# Patient Record
Sex: Female | Born: 1945 | Hispanic: No | Marital: Married | Smoking: Former smoker
Health system: Southern US, Community
[De-identification: ages and names within clinical notes are randomized; demographics above are authoritative.]

## PROBLEM LIST (undated history)

## (undated) DIAGNOSIS — R7303 Prediabetes: Secondary | ICD-10-CM

## (undated) DIAGNOSIS — I639 Cerebral infarction, unspecified: Secondary | ICD-10-CM

## (undated) DIAGNOSIS — R002 Palpitations: Secondary | ICD-10-CM

## (undated) DIAGNOSIS — M1990 Unspecified osteoarthritis, unspecified site: Secondary | ICD-10-CM

## (undated) DIAGNOSIS — Z9889 Other specified postprocedural states: Secondary | ICD-10-CM

## (undated) DIAGNOSIS — R112 Nausea with vomiting, unspecified: Secondary | ICD-10-CM

## (undated) DIAGNOSIS — I10 Essential (primary) hypertension: Secondary | ICD-10-CM

## (undated) HISTORY — PX: OTHER SURGICAL HISTORY: SHX169

---

## 2000-12-05 ENCOUNTER — Other Ambulatory Visit: Admission: RE | Admit: 2000-12-05 | Discharge: 2000-12-05 | Payer: Self-pay | Admitting: Internal Medicine

## 2000-12-13 ENCOUNTER — Encounter: Payer: Self-pay | Admitting: Internal Medicine

## 2000-12-13 ENCOUNTER — Encounter: Admission: RE | Admit: 2000-12-13 | Discharge: 2000-12-13 | Payer: Self-pay | Admitting: Internal Medicine

## 2001-03-20 ENCOUNTER — Encounter: Payer: Self-pay | Admitting: Internal Medicine

## 2001-03-20 ENCOUNTER — Inpatient Hospital Stay (HOSPITAL_COMMUNITY): Admission: EM | Admit: 2001-03-20 | Discharge: 2001-03-21 | Payer: Self-pay | Admitting: Emergency Medicine

## 2001-10-06 ENCOUNTER — Encounter: Admission: RE | Admit: 2001-10-06 | Discharge: 2002-01-04 | Payer: Self-pay

## 2005-11-22 ENCOUNTER — Other Ambulatory Visit: Admission: RE | Admit: 2005-11-22 | Discharge: 2005-11-22 | Payer: Self-pay | Admitting: Gynecology

## 2011-04-20 ENCOUNTER — Other Ambulatory Visit: Payer: Self-pay | Admitting: Internal Medicine

## 2011-04-20 DIAGNOSIS — Z1231 Encounter for screening mammogram for malignant neoplasm of breast: Secondary | ICD-10-CM

## 2011-05-10 ENCOUNTER — Ambulatory Visit
Admission: RE | Admit: 2011-05-10 | Discharge: 2011-05-10 | Disposition: A | Discharge status: Home / Self Care | Payer: Medicare HMO | Source: Ambulatory Visit | Attending: Internal Medicine | Admitting: Internal Medicine

## 2011-05-10 DIAGNOSIS — Z1231 Encounter for screening mammogram for malignant neoplasm of breast: Secondary | ICD-10-CM

## 2015-06-09 ENCOUNTER — Other Ambulatory Visit: Payer: Self-pay | Admitting: Internal Medicine

## 2015-06-09 DIAGNOSIS — Z1231 Encounter for screening mammogram for malignant neoplasm of breast: Secondary | ICD-10-CM

## 2015-06-28 DIAGNOSIS — E119 Type 2 diabetes mellitus without complications: Secondary | ICD-10-CM | POA: Diagnosis not present

## 2015-07-23 DIAGNOSIS — K219 Gastro-esophageal reflux disease without esophagitis: Secondary | ICD-10-CM | POA: Diagnosis not present

## 2015-07-23 DIAGNOSIS — E785 Hyperlipidemia, unspecified: Secondary | ICD-10-CM | POA: Diagnosis not present

## 2015-07-23 DIAGNOSIS — I10 Essential (primary) hypertension: Secondary | ICD-10-CM | POA: Diagnosis not present

## 2015-08-20 ENCOUNTER — Ambulatory Visit: Payer: Medicare HMO

## 2015-09-25 DIAGNOSIS — E119 Type 2 diabetes mellitus without complications: Secondary | ICD-10-CM | POA: Diagnosis not present

## 2015-09-25 DIAGNOSIS — L0889 Other specified local infections of the skin and subcutaneous tissue: Secondary | ICD-10-CM | POA: Diagnosis not present

## 2015-09-25 DIAGNOSIS — Z6827 Body mass index (BMI) 27.0-27.9, adult: Secondary | ICD-10-CM | POA: Diagnosis not present

## 2015-09-25 DIAGNOSIS — I10 Essential (primary) hypertension: Secondary | ICD-10-CM | POA: Diagnosis not present

## 2015-09-27 DIAGNOSIS — E119 Type 2 diabetes mellitus without complications: Secondary | ICD-10-CM | POA: Diagnosis not present

## 2015-12-08 DIAGNOSIS — M859 Disorder of bone density and structure, unspecified: Secondary | ICD-10-CM | POA: Diagnosis not present

## 2015-12-08 DIAGNOSIS — I10 Essential (primary) hypertension: Secondary | ICD-10-CM | POA: Diagnosis not present

## 2015-12-08 DIAGNOSIS — E784 Other hyperlipidemia: Secondary | ICD-10-CM | POA: Diagnosis not present

## 2015-12-08 DIAGNOSIS — E119 Type 2 diabetes mellitus without complications: Secondary | ICD-10-CM | POA: Diagnosis not present

## 2015-12-15 DIAGNOSIS — R0989 Other specified symptoms and signs involving the circulatory and respiratory systems: Secondary | ICD-10-CM | POA: Diagnosis not present

## 2015-12-15 DIAGNOSIS — B070 Plantar wart: Secondary | ICD-10-CM | POA: Diagnosis not present

## 2015-12-15 DIAGNOSIS — Z1231 Encounter for screening mammogram for malignant neoplasm of breast: Secondary | ICD-10-CM | POA: Diagnosis not present

## 2015-12-15 DIAGNOSIS — Z23 Encounter for immunization: Secondary | ICD-10-CM | POA: Diagnosis not present

## 2015-12-15 DIAGNOSIS — Z8673 Personal history of transient ischemic attack (TIA), and cerebral infarction without residual deficits: Secondary | ICD-10-CM | POA: Diagnosis not present

## 2015-12-15 DIAGNOSIS — E784 Other hyperlipidemia: Secondary | ICD-10-CM | POA: Diagnosis not present

## 2015-12-15 DIAGNOSIS — I10 Essential (primary) hypertension: Secondary | ICD-10-CM | POA: Diagnosis not present

## 2015-12-15 DIAGNOSIS — Z0000 Encounter for general adult medical examination without abnormal findings: Secondary | ICD-10-CM | POA: Diagnosis not present

## 2015-12-15 DIAGNOSIS — I6789 Other cerebrovascular disease: Secondary | ICD-10-CM | POA: Diagnosis not present

## 2015-12-15 DIAGNOSIS — M859 Disorder of bone density and structure, unspecified: Secondary | ICD-10-CM | POA: Diagnosis not present

## 2015-12-15 DIAGNOSIS — E119 Type 2 diabetes mellitus without complications: Secondary | ICD-10-CM | POA: Diagnosis not present

## 2015-12-19 DIAGNOSIS — Z1212 Encounter for screening for malignant neoplasm of rectum: Secondary | ICD-10-CM | POA: Diagnosis not present

## 2015-12-27 DIAGNOSIS — E119 Type 2 diabetes mellitus without complications: Secondary | ICD-10-CM | POA: Diagnosis not present

## 2016-03-27 DIAGNOSIS — E119 Type 2 diabetes mellitus without complications: Secondary | ICD-10-CM | POA: Diagnosis not present

## 2016-05-12 DIAGNOSIS — M859 Disorder of bone density and structure, unspecified: Secondary | ICD-10-CM | POA: Diagnosis not present

## 2016-06-26 DIAGNOSIS — E119 Type 2 diabetes mellitus without complications: Secondary | ICD-10-CM | POA: Diagnosis not present

## 2016-08-02 ENCOUNTER — Ambulatory Visit (HOSPITAL_BASED_OUTPATIENT_CLINIC_OR_DEPARTMENT_OTHER)
Admission: RE | Admit: 2016-08-02 | Discharge: 2016-08-02 | Disposition: A | Discharge status: Home / Self Care | Payer: Medicare HMO | Source: Ambulatory Visit | Attending: Internal Medicine | Admitting: Internal Medicine

## 2016-08-02 DIAGNOSIS — Z1231 Encounter for screening mammogram for malignant neoplasm of breast: Secondary | ICD-10-CM | POA: Diagnosis not present

## 2016-09-25 DIAGNOSIS — E119 Type 2 diabetes mellitus without complications: Secondary | ICD-10-CM | POA: Diagnosis not present

## 2016-11-11 DIAGNOSIS — R69 Illness, unspecified: Secondary | ICD-10-CM | POA: Diagnosis not present

## 2016-11-11 DIAGNOSIS — H9313 Tinnitus, bilateral: Secondary | ICD-10-CM | POA: Diagnosis not present

## 2016-11-11 DIAGNOSIS — Z0000 Encounter for general adult medical examination without abnormal findings: Secondary | ICD-10-CM | POA: Diagnosis not present

## 2016-11-11 DIAGNOSIS — M2391 Unspecified internal derangement of right knee: Secondary | ICD-10-CM | POA: Diagnosis not present

## 2016-11-11 DIAGNOSIS — K219 Gastro-esophageal reflux disease without esophagitis: Secondary | ICD-10-CM | POA: Diagnosis not present

## 2016-11-11 DIAGNOSIS — E785 Hyperlipidemia, unspecified: Secondary | ICD-10-CM | POA: Diagnosis not present

## 2016-11-11 DIAGNOSIS — R499 Unspecified voice and resonance disorder: Secondary | ICD-10-CM | POA: Diagnosis not present

## 2016-11-11 DIAGNOSIS — M2392 Unspecified internal derangement of left knee: Secondary | ICD-10-CM | POA: Diagnosis not present

## 2016-11-11 DIAGNOSIS — I10 Essential (primary) hypertension: Secondary | ICD-10-CM | POA: Diagnosis not present

## 2016-11-11 DIAGNOSIS — M25562 Pain in left knee: Secondary | ICD-10-CM | POA: Diagnosis not present

## 2016-11-30 DIAGNOSIS — R69 Illness, unspecified: Secondary | ICD-10-CM | POA: Diagnosis not present

## 2016-11-30 DIAGNOSIS — J381 Polyp of vocal cord and larynx: Secondary | ICD-10-CM | POA: Diagnosis not present

## 2016-11-30 DIAGNOSIS — J343 Hypertrophy of nasal turbinates: Secondary | ICD-10-CM | POA: Diagnosis not present

## 2016-12-17 ENCOUNTER — Other Ambulatory Visit: Payer: Self-pay | Admitting: Otolaryngology

## 2016-12-17 NOTE — Pre-Procedure Instructions (Signed)
Caitlin Nash  12/17/2016      [id number] - [place], [place] [address] [place] [address] Phone: [phone] Fax: [phone]    Your procedure is scheduled on Friday May 11.  Report to [facility] Admitting at 9:00 A.M.  Call this number if you have problems the morning of surgery:  [phone]   Remember:  Do not eat food or drink liquids after midnight.  Take these medicines the morning of surgery with A SIP OF WATER: Ranitidine (zantac)  7 days prior to surgery STOP taking any Aspirin, Aleve, Naproxen, Ibuprofen, Motrin, Advil, Goody's, BC's, all herbal medications, fish oil, and all vitamins    Do not wear jewelry, make-up or nail polish.  Do not wear lotions, powders, or perfumes, or deoderant.  Do not shave 48 hours prior to surgery.  Men may shave face and neck.  Do not bring valuables to the hospital.  [facility] is not responsible for any belongings or valuables.  Contacts, dentures or bridgework may not be worn into surgery.  Leave your suitcase in the car.  After surgery it may be brought to your room.  For patients admitted to the hospital, discharge time will be determined by your treatment team.  Patients discharged the day of surgery will not be allowed to drive home.    Special instructions:    [place]- Preparing For Surgery  Before surgery, you can play an important role. Because skin is not sterile, your skin needs to be as free of germs as possible. You can reduce the number of germs on your skin by washing with CHG (chlorahexidine gluconate) Soap before surgery.  CHG is an antiseptic cleaner which kills germs and bonds with the skin to continue killing germs even after washing.  Please do not use if you have an allergy to CHG or antibacterial soaps. If your skin becomes reddened/irritated stop using the CHG.  Do not shave (including legs and underarms) for at least 48 hours prior to first CHG  shower. It is OK to shave your face.  Please follow these instructions carefully.   1. Shower the NIGHT BEFORE SURGERY and the MORNING OF SURGERY with CHG.   2. If you chose to wash your hair, wash your hair first as usual with your normal shampoo.  3. After you shampoo, rinse your hair and body thoroughly to remove the shampoo.  4. Use CHG as you would any other liquid soap. You can apply CHG directly to the skin and wash gently with a scrungie or a clean washcloth.   5. Apply the CHG Soap to your body ONLY FROM THE NECK DOWN.  Do not use on open wounds or open sores. Avoid contact with your eyes, ears, mouth and genitals (private parts). Wash genitals (private parts) with your normal soap.  6. Wash thoroughly, paying special attention to the area where your surgery will be performed.  7. Thoroughly rinse your body with warm water from the neck down.  8. DO NOT shower/wash with your normal soap after using and rinsing off the CHG Soap.  9. Pat yourself dry with a CLEAN TOWEL.   10. Wear CLEAN PAJAMAS   11. Place CLEAN SHEETS on your bed the night of your first shower and DO NOT SLEEP WITH PETS.    Day of Surgery: Do not apply any deodorants/lotions. Please wear clean clothes to the hospital/surgery center.

## 2016-12-20 ENCOUNTER — Encounter (HOSPITAL_COMMUNITY): Payer: Self-pay | Admitting: Urology

## 2016-12-20 ENCOUNTER — Encounter (HOSPITAL_COMMUNITY)
Admission: RE | Admit: 2016-12-20 | Discharge: 2016-12-20 | Disposition: A | Discharge status: Home / Self Care | Payer: Medicare HMO | Source: Ambulatory Visit | Attending: Otolaryngology | Admitting: Otolaryngology

## 2016-12-20 DIAGNOSIS — Z87891 Personal history of nicotine dependence: Secondary | ICD-10-CM | POA: Diagnosis not present

## 2016-12-20 DIAGNOSIS — Z885 Allergy status to narcotic agent status: Secondary | ICD-10-CM | POA: Diagnosis not present

## 2016-12-20 DIAGNOSIS — Z880 Allergy status to penicillin: Secondary | ICD-10-CM | POA: Diagnosis not present

## 2016-12-20 DIAGNOSIS — D141 Benign neoplasm of larynx: Secondary | ICD-10-CM | POA: Diagnosis not present

## 2016-12-20 DIAGNOSIS — R490 Dysphonia: Secondary | ICD-10-CM | POA: Diagnosis not present

## 2016-12-20 DIAGNOSIS — R7303 Prediabetes: Secondary | ICD-10-CM | POA: Diagnosis not present

## 2016-12-20 DIAGNOSIS — Z01812 Encounter for preprocedural laboratory examination: Secondary | ICD-10-CM | POA: Insufficient documentation

## 2016-12-20 DIAGNOSIS — Z8673 Personal history of transient ischemic attack (TIA), and cerebral infarction without residual deficits: Secondary | ICD-10-CM | POA: Insufficient documentation

## 2016-12-20 DIAGNOSIS — K219 Gastro-esophageal reflux disease without esophagitis: Secondary | ICD-10-CM | POA: Diagnosis not present

## 2016-12-20 DIAGNOSIS — I10 Essential (primary) hypertension: Secondary | ICD-10-CM | POA: Diagnosis not present

## 2016-12-20 DIAGNOSIS — Z9889 Other specified postprocedural states: Secondary | ICD-10-CM | POA: Diagnosis not present

## 2016-12-20 DIAGNOSIS — Z87892 Personal history of anaphylaxis: Secondary | ICD-10-CM | POA: Diagnosis not present

## 2016-12-20 DIAGNOSIS — Z79899 Other long term (current) drug therapy: Secondary | ICD-10-CM | POA: Diagnosis not present

## 2016-12-20 DIAGNOSIS — Z881 Allergy status to other antibiotic agents status: Secondary | ICD-10-CM | POA: Diagnosis not present

## 2016-12-20 DIAGNOSIS — Z888 Allergy status to other drugs, medicaments and biological substances status: Secondary | ICD-10-CM | POA: Diagnosis not present

## 2016-12-20 HISTORY — DX: Nausea with vomiting, unspecified: R11.2

## 2016-12-20 HISTORY — DX: Essential (primary) hypertension: I10

## 2016-12-20 HISTORY — DX: Palpitations: R00.2

## 2016-12-20 HISTORY — DX: Cerebral infarction, unspecified: I63.9

## 2016-12-20 HISTORY — DX: Unspecified osteoarthritis, unspecified site: M19.90

## 2016-12-20 HISTORY — DX: Prediabetes: R73.03

## 2016-12-20 HISTORY — DX: Other specified postprocedural states: Z98.890

## 2016-12-20 LAB — BASIC METABOLIC PANEL
Anion gap: 8 (ref 5–15)
BUN: 16 mg/dL (ref 6–20)
CALCIUM: 9.7 mg/dL (ref 8.9–10.3)
CO2: 27 mmol/L (ref 22–32)
CREATININE: 0.71 mg/dL (ref 0.44–1.00)
Chloride: 105 mmol/L (ref 101–111)
GFR calc non Af Amer: >60 mL/min (ref >60)
Glucose, Bld: 133 mg/dL — ABNORMAL HIGH (ref 65–99)
Potassium: 4.5 mmol/L (ref 3.5–5.1)
Sodium: 140 mmol/L (ref 135–145)

## 2016-12-20 LAB — CBC
HCT: 46.2 % — ABNORMAL HIGH (ref 36.0–46.0)
Hemoglobin: 15.4 g/dL — ABNORMAL HIGH (ref 12.0–15.0)
MCH: 29.6 pg (ref 26.0–34.0)
MCHC: 33.3 g/dL (ref 30.0–36.0)
MCV: 88.8 fL (ref 78.0–100.0)
PLATELETS: 312 10*3/uL (ref 150–400)
RBC: 5.2 MIL/uL — ABNORMAL HIGH (ref 3.87–5.11)
RDW: 14 % (ref 11.5–15.5)
WBC: 8.3 10*3/uL (ref 4.0–10.5)

## 2016-12-20 LAB — GLUCOSE, CAPILLARY: Glucose-Capillary: 132 mg/dL — ABNORMAL HIGH (ref 65–99)

## 2016-12-20 NOTE — Progress Notes (Addendum)
PCP - Dr. Crist Infante Pt denies having cardiologist or neurologist. Pt states "Dr. Joylene Draft is the only doctor I need" Pt with hx of stroke, but states she has not been to [place] and does not know where she has had medical treatment elsewhere. Pt then told me that she came to [facility] when she had her stroke.   EKG - 12/20/2016 pt has been having palpitations, no other symptoms associated with palpitations.  Patient unsure of any other cardiac studies.   Patient is pre-diabetic. Pt states she does not check her CBG at home because her meter doesn't work and she hasn't been checking them for a while. Hgb A1c done today.   Patient denies shortness of breath, fever, cough and chest pain at PAT appointment   Patient verbalized understanding of instructions that was given to them at the PAT appointment. Patient expressed that there were no further questions.  Patient was also instructed that they will need to review over the PAT instructions again at home before the surgery.

## 2016-12-21 LAB — HEMOGLOBIN A1C
Hgb A1c MFr Bld: 6.6 % — ABNORMAL HIGH (ref 4.8–5.6)
Mean Plasma Glucose: 143 mg/dL

## 2016-12-21 NOTE — Progress Notes (Signed)
Anesthesia Chart Review:  Pt is a 71 year old female scheduled for microlaryngoscopy with CO2 laser and excision of vocal cord lesion on 12/24/2016 with Melida Quitter, M.D.  - PCP is Crist Infante, M.D.  [facility] includes: HTN, palpitations, pre-diabetes, stroke, post-op N/V. Former smoker. BMI 30.  Medications include: Lipitor, ramipril, Zantac  Preoperative labs reviewed. HbA1c 6.6, glucose 133  EKG 12/20/16: NSR. Low voltage QRS.  If no changes, I anticipate pt can proceed with surgery as scheduled.   Willeen Cass, FNP-BC [facility] Short Stay Surgical Center/Anesthesiology Phone: [phone] 12/21/2016 3:17 PM

## 2016-12-24 ENCOUNTER — Ambulatory Visit (HOSPITAL_COMMUNITY): Payer: Medicare HMO | Admitting: Vascular Surgery

## 2016-12-24 ENCOUNTER — Encounter (HOSPITAL_COMMUNITY)
Admission: RE | Disposition: A | Discharge status: Home / Self Care | Payer: Self-pay | Source: Ambulatory Visit | Attending: Otolaryngology

## 2016-12-24 ENCOUNTER — Ambulatory Visit (HOSPITAL_COMMUNITY): Payer: Medicare HMO | Admitting: Anesthesiology

## 2016-12-24 ENCOUNTER — Encounter (HOSPITAL_COMMUNITY): Payer: Self-pay | Admitting: *Deleted

## 2016-12-24 ENCOUNTER — Ambulatory Visit (HOSPITAL_COMMUNITY)
Admission: RE | Admit: 2016-12-24 | Discharge: 2016-12-24 | Disposition: A | Discharge status: Home / Self Care | Payer: Medicare HMO | Source: Ambulatory Visit | Attending: Otolaryngology | Admitting: Otolaryngology

## 2016-12-24 DIAGNOSIS — Z888 Allergy status to other drugs, medicaments and biological substances status: Secondary | ICD-10-CM | POA: Insufficient documentation

## 2016-12-24 DIAGNOSIS — Z881 Allergy status to other antibiotic agents status: Secondary | ICD-10-CM | POA: Insufficient documentation

## 2016-12-24 DIAGNOSIS — Z87892 Personal history of anaphylaxis: Secondary | ICD-10-CM | POA: Insufficient documentation

## 2016-12-24 DIAGNOSIS — R490 Dysphonia: Secondary | ICD-10-CM | POA: Diagnosis not present

## 2016-12-24 DIAGNOSIS — I10 Essential (primary) hypertension: Secondary | ICD-10-CM | POA: Insufficient documentation

## 2016-12-24 DIAGNOSIS — J381 Polyp of vocal cord and larynx: Secondary | ICD-10-CM | POA: Diagnosis not present

## 2016-12-24 DIAGNOSIS — Z79899 Other long term (current) drug therapy: Secondary | ICD-10-CM | POA: Diagnosis not present

## 2016-12-24 DIAGNOSIS — Z880 Allergy status to penicillin: Secondary | ICD-10-CM | POA: Insufficient documentation

## 2016-12-24 DIAGNOSIS — Z9889 Other specified postprocedural states: Secondary | ICD-10-CM | POA: Diagnosis not present

## 2016-12-24 DIAGNOSIS — R7303 Prediabetes: Secondary | ICD-10-CM | POA: Insufficient documentation

## 2016-12-24 DIAGNOSIS — D141 Benign neoplasm of larynx: Secondary | ICD-10-CM | POA: Insufficient documentation

## 2016-12-24 DIAGNOSIS — Z87891 Personal history of nicotine dependence: Secondary | ICD-10-CM | POA: Diagnosis not present

## 2016-12-24 DIAGNOSIS — Z8673 Personal history of transient ischemic attack (TIA), and cerebral infarction without residual deficits: Secondary | ICD-10-CM | POA: Diagnosis not present

## 2016-12-24 DIAGNOSIS — J387 Other diseases of larynx: Secondary | ICD-10-CM | POA: Diagnosis not present

## 2016-12-24 DIAGNOSIS — K219 Gastro-esophageal reflux disease without esophagitis: Secondary | ICD-10-CM | POA: Diagnosis not present

## 2016-12-24 DIAGNOSIS — Z885 Allergy status to narcotic agent status: Secondary | ICD-10-CM | POA: Insufficient documentation

## 2016-12-24 HISTORY — PX: MICROLARYNGOSCOPY WITH CO2 LASER AND EXCISION OF VOCAL CORD LESION: SHX5970

## 2016-12-24 SURGERY — MICROLARYNGOSCOPY WITH CO2 LASER AND EXCISION OF VOCAL CORD LESION
Anesthesia: General

## 2016-12-24 MED ORDER — GLYCOPYRROLATE 0.2 MG/ML IJ SOLN
INTRAMUSCULAR | Status: DC | PRN
  Administered 2016-12-24: 0.2 mg via INTRAVENOUS

## 2016-12-24 MED ORDER — FENTANYL CITRATE (PF) 250 MCG/5ML IJ SOLN
INTRAMUSCULAR | Status: AC
  Filled 2016-12-24: qty 5

## 2016-12-24 MED ORDER — LACTATED RINGERS IV SOLN
INTRAVENOUS | Status: DC
  Administered 2016-12-24 (×2): via INTRAVENOUS

## 2016-12-24 MED ORDER — EPINEPHRINE PF 1 MG/ML IJ SOLN
INTRAMUSCULAR | Status: DC | PRN
  Administered 2016-12-24: 30 mL

## 2016-12-24 MED ORDER — SUGAMMADEX SODIUM 200 MG/2ML IV SOLN
INTRAVENOUS | Status: DC | PRN
  Administered 2016-12-24: 300 mg via INTRAVENOUS

## 2016-12-24 MED ORDER — LIDOCAINE HCL (CARDIAC) 20 MG/ML IV SOLN
INTRAVENOUS | Status: DC | PRN
  Administered 2016-12-24: 50 mg via INTRAVENOUS

## 2016-12-24 MED ORDER — PHENYLEPHRINE 40 MCG/ML (10ML) SYRINGE FOR IV PUSH (FOR BLOOD PRESSURE SUPPORT)
PREFILLED_SYRINGE | INTRAVENOUS | Status: AC
Start: 2016-12-24 — End: ?
  Filled 2016-12-24: qty 10

## 2016-12-24 MED ORDER — ONDANSETRON HCL 4 MG/2ML IJ SOLN
INTRAMUSCULAR | Status: AC
  Filled 2016-12-24: qty 2

## 2016-12-24 MED ORDER — PROPOFOL 500 MG/50ML IV EMUL
INTRAVENOUS | Status: DC | PRN
  Administered 2016-12-24: 150 ug/kg/min via INTRAVENOUS

## 2016-12-24 MED ORDER — 0.9 % SODIUM CHLORIDE (POUR BTL) OPTIME
TOPICAL | Status: DC | PRN
  Administered 2016-12-24: 1000 mL

## 2016-12-24 MED ORDER — PROPOFOL 10 MG/ML IV BOLUS
INTRAVENOUS | Status: AC
  Filled 2016-12-24: qty 20

## 2016-12-24 MED ORDER — OXYCODONE HCL 5 MG/5ML PO SOLN: 5.0000 mg | Freq: Once | ORAL | Status: DC | PRN

## 2016-12-24 MED ORDER — SUCCINYLCHOLINE CHLORIDE 20 MG/ML IJ SOLN
INTRAMUSCULAR | Status: DC | PRN
  Administered 2016-12-24: 60 mg via INTRAVENOUS

## 2016-12-24 MED ORDER — FENTANYL CITRATE (PF) 100 MCG/2ML IJ SOLN
INTRAMUSCULAR | Status: DC | PRN
  Administered 2016-12-24: 50 ug via INTRAVENOUS
  Administered 2016-12-24: 200 ug via INTRAVENOUS

## 2016-12-24 MED ORDER — MIDAZOLAM HCL 2 MG/2ML IJ SOLN
INTRAMUSCULAR | Status: AC
  Filled 2016-12-24: qty 2

## 2016-12-24 MED ORDER — OMEPRAZOLE 40 MG PO CPDR: 40.0000 mg | DELAYED_RELEASE_CAPSULE | Freq: Two times a day (BID) | ORAL | 5 refills | Status: AC

## 2016-12-24 MED ORDER — LIDOCAINE 2% (20 MG/ML) 5 ML SYRINGE
INTRAMUSCULAR | Status: AC
  Filled 2016-12-24: qty 5

## 2016-12-24 MED ORDER — MIDAZOLAM HCL 5 MG/5ML IJ SOLN
INTRAMUSCULAR | Status: DC | PRN
  Administered 2016-12-24: 0.5 mg via INTRAVENOUS

## 2016-12-24 MED ORDER — ONDANSETRON HCL 4 MG/2ML IJ SOLN
INTRAMUSCULAR | Status: DC | PRN
  Administered 2016-12-24: 4 mg via INTRAVENOUS

## 2016-12-24 MED ORDER — PROPOFOL 10 MG/ML IV BOLUS
INTRAVENOUS | Status: DC | PRN
  Administered 2016-12-24: 50 mg via INTRAVENOUS
  Administered 2016-12-24: 100 mg via INTRAVENOUS

## 2016-12-24 MED ORDER — FENTANYL CITRATE (PF) 100 MCG/2ML IJ SOLN
25.0000 ug | INTRAMUSCULAR | Status: DC | PRN
  Administered 2016-12-24 (×2): 50 ug via INTRAVENOUS

## 2016-12-24 MED ORDER — FENTANYL CITRATE (PF) 100 MCG/2ML IJ SOLN
INTRAMUSCULAR | Status: AC
Start: 2016-12-24 — End: 2016-12-24
  Administered 2016-12-24: 50 ug via INTRAVENOUS
  Filled 2016-12-24: qty 2

## 2016-12-24 MED ORDER — OXYCODONE HCL 5 MG PO TABS: 5.0000 mg | ORAL_TABLET | Freq: Once | ORAL | Status: DC | PRN

## 2016-12-24 MED ORDER — ROCURONIUM BROMIDE 100 MG/10ML IV SOLN
INTRAVENOUS | Status: DC | PRN
  Administered 2016-12-24: 60 mg via INTRAVENOUS

## 2016-12-24 MED ORDER — SCOPOLAMINE 1 MG/3DAYS TD PT72
1.0000 | MEDICATED_PATCH | TRANSDERMAL | Status: DC
  Administered 2016-12-24: 1.5 mg via TRANSDERMAL
  Filled 2016-12-24: qty 1

## 2016-12-24 MED ORDER — EPINEPHRINE HCL (NASAL) 0.1 % NA SOLN
NASAL | Status: AC
Start: 2016-12-24 — End: ?
  Filled 2016-12-24: qty 30

## 2016-12-24 MED ORDER — DEXAMETHASONE SODIUM PHOSPHATE 10 MG/ML IJ SOLN
INTRAMUSCULAR | Status: DC | PRN
  Administered 2016-12-24: 10 mg via INTRAVENOUS

## 2016-12-24 SURGICAL SUPPLY — 22 items
BANDAGE EYE OVAL (MISCELLANEOUS) IMPLANT
BLADE SURG 15 STRL LF DISP TIS (BLADE) IMPLANT
BLADE SURG 15 STRL SS (BLADE)
CANISTER SUCT 3000ML PPV (MISCELLANEOUS) ×2 IMPLANT
CONT SPEC 4OZ CLIKSEAL STRL BL (MISCELLANEOUS) ×4 IMPLANT
COVER BACK TABLE 60X90IN (DRAPES) ×2 IMPLANT
CRADLE DONUT ADULT HEAD (MISCELLANEOUS) ×2 IMPLANT
DRAPE HALF SHEET 40X57 (DRAPES) ×2 IMPLANT
GLOVE BIO SURGEON STRL SZ7.5 (GLOVE) ×2 IMPLANT
GLOVE BIOGEL PI IND STRL 7.0 (GLOVE) ×1 IMPLANT
GLOVE BIOGEL PI INDICATOR 7.0 (GLOVE) ×1
GLOVE SURG SS PI 7.0 STRL IVOR (GLOVE) ×2 IMPLANT
GOWN STRL REUS W/ TWL LRG LVL3 (GOWN DISPOSABLE) ×2 IMPLANT
GOWN STRL REUS W/TWL LRG LVL3 (GOWN DISPOSABLE) ×4
GUARD TEETH (MISCELLANEOUS) ×2 IMPLANT
KIT BASIN OR (CUSTOM PROCEDURE TRAY) ×2 IMPLANT
KIT ROOM TURNOVER OR (KITS) ×2 IMPLANT
NS IRRIG 1000ML POUR BTL (IV SOLUTION) ×2 IMPLANT
PAD ARMBOARD 7.5X6 YLW CONV (MISCELLANEOUS) ×2 IMPLANT
PATTIES SURGICAL .5 X3 (DISPOSABLE) ×2 IMPLANT
TOWEL OR 17X24 6PK STRL BLUE (TOWEL DISPOSABLE) ×4 IMPLANT
TUBE CONNECTING 12X1/4 (SUCTIONS) ×2 IMPLANT

## 2016-12-24 NOTE — Anesthesia Preprocedure Evaluation (Signed)
Anesthesia Evaluation  Patient identified by MRN, date of birth, ID band Patient awake    Reviewed: Allergy & Precautions, NPO status , Patient's Chart, lab work & pertinent test results  History of Anesthesia Complications (+) PONV and history of anesthetic complications  Airway Mallampati: II  TM Distance: >3 FB Neck ROM: Full    Dental  (+) Dental Advisory Given   Pulmonary former smoker,    breath sounds clear to auscultation       Cardiovascular hypertension, Pt. on medications  Rhythm:Regular     Neuro/Psych CVA negative psych ROS   GI/Hepatic Neg liver ROS, GERD  Medicated and Controlled,  Endo/Other  negative endocrine ROS  Renal/GU negative Renal ROS     Musculoskeletal  (+) Arthritis ,   Abdominal   Peds  Hematology negative hematology ROS (+)   Anesthesia Other Findings   Reproductive/Obstetrics                             Anesthesia Physical Anesthesia Plan  ASA: III  Anesthesia Plan: General   Post-op Pain Management:    Induction: Intravenous  Airway Management Planned: Oral ETT  Additional Equipment: None  Intra-op Plan:   Post-operative Plan: Extubation in OR  Informed Consent: I have reviewed the patients History and Physical, chart, labs and discussed the procedure including the risks, benefits and alternatives for the proposed anesthesia with the patient or authorized representative who has indicated his/her understanding and acceptance.   Dental advisory given  Plan Discussed with: CRNA and Surgeon  Anesthesia Plan Comments:         Anesthesia Quick Evaluation

## 2016-12-24 NOTE — Transfer of Care (Signed)
Immediate Anesthesia Transfer of Care Note  Patient: Caitlin Nash  Procedure(s) Performed: Procedure(s) with comments: MICROLARYNGOSCOPY WITH CO2 LASER EXCISION OF VOCAL CORD POLYPS (N/A) - Micro Direct Laryngoscopy with CO2 Laser excision of vocal fold polyps, Jet Ventilation    Patient Location: PACU  Anesthesia Type:General  Level of Consciousness: awake, alert , oriented and patient cooperative  Airway & Oxygen Therapy: Patient Spontanous Breathing  Post-op Assessment: Report given to RN and Post -op Vital signs reviewed and stable  Post vital signs: Reviewed and stable  Last Vitals:  Vitals:   12/24/16 0917  BP: (!) 143/53  Pulse: 72  Resp: 20  Temp: 37.1 C    Last Pain:  Vitals:   12/24/16 0917  TempSrc: Oral      Patients Stated Pain Goal: 2 (57/47/34 0370)  Complications: No apparent anesthesia complications

## 2016-12-24 NOTE — Discharge Instructions (Signed)
Take omeprazole 30 minutes before breakfast and 30 minutes before supper.  Prescription sent to your pharmacy.  Take ranitidine dosing at bedtime.

## 2016-12-24 NOTE — H&P (Signed)
Caitlin Nash is an 71 y.o. female.   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  Past Medical History:  Diagnosis Date  . Arthritis   . Hypertension   . Palpitations    used to have them frequently   . PONV (postoperative nausea and vomiting)    phenergan helps  . Pre-diabetes   . Stroke [facility])    "i dont think like i used to" pt unsure of when "at least 5 years ago"    Past Surgical History:  Procedure Laterality Date  . OTHER SURGICAL HISTORY     "female surgeries down below"    History reviewed. No pertinent family history. Social History:  reports that she has quit smoking. Her smoking use included Cigarettes. She quit after 54.00 years of use. She has never used smokeless tobacco. She reports that she does not drink alcohol or use drugs.  Allergies:  Allergies  Allergen Reactions  . Penicillins Anaphylaxis and Swelling    SWELLING OF THROAT  . Acetaminophen     UNSPECIFIED REACTION  ?  b/c patient is on Lipitor  ?  . Ciprofloxacin Nausea And Vomiting  . Codeine Nausea And Vomiting  . Diamox [Acetazolamide] Nausea And Vomiting  . Erythromycin Nausea And Vomiting    TOLERATES E-MYCIN  . Gabapentin Nausea And Vomiting  . Lexapro [Escitalopram] Nausea And Vomiting  . Prednisone Nausea And Vomiting  . Vicodin [Hydrocodone-Acetaminophen] Nausea And Vomiting  . Vitamin D Analogs Nausea And Vomiting  . Zetia [Ezetimibe] Nausea And Vomiting  . Zoloft [Sertraline] Nausea And Vomiting    Medications Prior to Admission  Medication Sig Dispense Refill  . atorvastatin (LIPITOR) 80 MG tablet Take 80 mg by mouth every evening.    . Cholecalciferol (VITAMIN D) 2000 units tablet Take 1,000 Units by mouth daily. Has to cut med in half    . nicotine (NICODERM CQ - DOSED IN MG/24 HOURS) 21 mg/24hr patch Place 21 mg onto the skin daily.    . ramipril (ALTACE) 5 MG capsule  Take 5 mg by mouth daily.    . ranitidine (ZANTAC) 150 MG tablet Take 150 mg by mouth 2 (two) times daily.      No results found for this or any previous visit (from the past 48 hour(s)). No results found.  Review of Systems  All other systems reviewed and are negative.   Blood pressure (!) 143/53, pulse 72, temperature 98.7 F (37.1 C), temperature source Oral, resp. rate 20, height 5\' 4"  (1.626 m), weight 164 lb (74.4 kg), SpO2 97 %. Physical Exam  Constitutional: She is oriented to person, place, and time. She appears well-developed and well-nourished. No distress.  HENT:  Head: Normocephalic and atraumatic.  Right Ear: External ear normal.  Left Ear: External ear normal.  Nose: Nose normal.  Mouth/Throat: Oropharynx is clear and moist.  Markedly hoarse, pinched sound.  Eyes: Conjunctivae and EOM are normal. Pupils are equal, round, and reactive to light.  Neck: Normal range of motion. Neck supple.  Cardiovascular: Normal rate.   Respiratory: Effort normal.  Neurological: She is alert and oriented to person, place, and time. No cranial nerve deficit.  Skin: Skin is warm and dry.  Psychiatric: She has a normal mood and affect. Her behavior is normal. Judgment and thought content normal.     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  Melida Quitter, MD 12/24/2016, 10:39 AM

## 2016-12-24 NOTE — Brief Op Note (Signed)
12/24/2016  12:52 PM  PATIENT:  Ardine Eng  71 y.o. female  PRE-OPERATIVE DIAGNOSIS:  Dysphonia, Vocal Fold Polyps  POST-OPERATIVE DIAGNOSIS:  Dysphonia, Vocal Fold Polyps  PROCEDURE:  Procedure(s) with comments: MICROLARYNGOSCOPY WITH CO2 LASER EXCISION OF VOCAL CORD POLYPS (N/A) - Micro Direct Laryngoscopy with CO2 Laser excision of vocal fold polyps, Jet Ventilation    SURGEON:  Surgeon(s) and Role:    Melida Quitter, MD - Primary  PHYSICIAN ASSISTANT:   ASSISTANTS: none   ANESTHESIA:   general  EBL:  No intake/output data recorded.  BLOOD ADMINISTERED:none  DRAINS: none   LOCAL MEDICATIONS USED:  NONE  SPECIMEN:  Source of Specimen:  Right and left laryngeal ventricle cysts  DISPOSITION OF SPECIMEN:  PATHOLOGY  COUNTS:  YES  TOURNIQUET:  * No tourniquets in log *  DICTATION: .Other Dictation: Dictation Number [id number]  PLAN OF CARE: Discharge to home after PACU  PATIENT DISPOSITION:  PACU - hemodynamically stable.   Delay start of Pharmacological VTE agent (>24hrs) due to surgical blood loss or risk of bleeding: no

## 2016-12-25 DIAGNOSIS — E119 Type 2 diabetes mellitus without complications: Secondary | ICD-10-CM | POA: Diagnosis not present

## 2016-12-27 ENCOUNTER — Encounter (HOSPITAL_COMMUNITY): Payer: Self-pay | Admitting: Otolaryngology

## 2016-12-27 NOTE — Anesthesia Postprocedure Evaluation (Addendum)
Anesthesia Post Note  Patient: Caitlin Nash  Procedure(s) Performed: Procedure(s) (LRB): MICROLARYNGOSCOPY WITH CO2 LASER EXCISION OF VOCAL CORD POLYPS (N/A)  Patient location during evaluation: PACU Anesthesia Type: General Level of consciousness: awake and alert Pain management: pain level controlled Vital Signs Assessment: post-procedure vital signs reviewed and stable Respiratory status: spontaneous breathing, nonlabored ventilation, respiratory function stable and patient connected to nasal cannula oxygen Cardiovascular status: blood pressure returned to baseline and stable Postop Assessment: no signs of nausea or vomiting Anesthetic complications: no       Last Vitals:  Vitals:   12/24/16 1338 12/24/16 1341  BP:  139/68  Pulse:  84  Resp:  (!) 21  Temp: 36.5 C     Last Pain:  Vitals:   12/24/16 1324  TempSrc:   PainSc: 5                  Cici Rodriges

## 2016-12-27 NOTE — Op Note (Signed)
NAME:  Caitlin Nash, FIORENZA NO.:  MEDICAL RECORD NO.:  [id number]  LOCATION:                                 FACILITY:  PHYSICIAN:  Onnie Graham, MD     DATE OF BIRTH:  04-17-1946  DATE OF PROCEDURE:  12/24/2016 DATE OF DISCHARGE:                              OPERATIVE REPORT   PREOPERATIVE DIAGNOSES: 1. Dysphonia. 2. Bilateral vocal fold polyps.  POSTOPERATIVE DIAGNOSES: 1. Dysphonia. 2. Bilateral laryngeal ventricle cysts.  SURGERY:  Suspended Microdirect laryngoscopy with CO2 laser excision of bilateral ventricle cysts.  SURGEON:  Onnie Graham, MD.  ANESTHESIA:  General endotracheal anesthesia.  COMPLICATIONS:  None.  INDICATION:  The patient is a 71 year old female with a 1-year history of worsening dysphonia, found to have what appeared to be bilateral vocal fold polyps by fiberoptic scope.  She presents to the operating room for surgical management.  FINDINGS:  Upon laryngoscopy, the larynx was carefully examined and the lesions turned out to be cystic lesions arising from the laryngeal ventricle on each side.  These were removed by first decompressing the fluid and then removing the cystic structure with CO2 laser.  The remainder of the vocal folds appeared healthy without mass or cyst.  DESCRIPTION OF PROCEDURE:  The patient was identified in the holding room, where informed consent having been obtained including discussion of risks, benefits, and alternatives, the patient was brought to the operative suite and put on the operative table in supine position. Anesthesia was induced.  The patient has intubated by anesthesia team with a laser safe tube without difficulty.  The patient was given intravenous steroids during the case.  The eyes taped closed and bed was turned 90 degrees from anesthesia.  A tooth guard was placed over the upper teeth and damp eye pads were taped over the eyes.  First, a Storz laryngoscope was used to try to get  good exposure of the larynx, but did not provide really clear exposure.  It was felt maybe the endotracheal tube was part of the problem, so it was backed out with the idea of proceeding with jet ventilation.  However, when the tube was backed out, the polyp/cysts obstructed the airway and jet ventilation could not be initiated.  The Storz laryngoscope was taken out of suspension and removed from the patient's mouth, and the larynx was re-exposed using a Miller blade with the anesthesia, laryngoscope and a 6 endotracheal tube was placed using a stylet.  The tube was taped to the left side and the larynx was then re-exposed using a Dedo laryngoscope, which was placed in suspension on the Mayo stand.  The 0-degree telescope was used to make a preoperative photograph.  The microscope was then brought into view and an epinephrine-soaked pledget was held against the left-sided polyp.  The polyp was then grasped and the pedicle searched for.  This caused a tear in the polyp, which resulted in a flow of thick mucus implying this to be a cyst.  The cyst was decompressed of fluid and was able to then be amputated from the ventricular opening using the CO2 laser on a  setting of 4 watts continuous.  This was passed for pathology.  The right-sided cyst was then able to be seen inferior to the left side of this and was then likewise decompressed and then amputated using the CO2 laser on the same setting.  After this was completed, a little bit of the pedicle remained on each side and was removed with the CO2 laser.  At this point, the Dedo laryngoscope was taken out of suspension and removed from the patient's mouth.  An anterior commissure laryngoscope was inserted to evaluate the larynx carefully and a 0-degree telescope was used to make a postoperative photograph.  The airway was suctioned and the laryngoscope removed from the patient's mouth.  The tooth guard was removed, and the patient  was turned back to Anesthesia for wake up, was extubated, and moved to the recovery room in stable condition.  Of note, during laser use, a damp towel was placed over the patient's face fully.     Onnie Graham, MD     DDB/MEDQ  D:  12/24/2016  T:  12/24/2016  Job:  [id number]

## 2017-01-07 DIAGNOSIS — R490 Dysphonia: Secondary | ICD-10-CM | POA: Diagnosis not present

## 2017-01-07 DIAGNOSIS — Z87891 Personal history of nicotine dependence: Secondary | ICD-10-CM | POA: Diagnosis not present

## 2017-01-14 MED FILL — Epinephrine HCl Nasal Soln 0.1%: NASAL | Qty: 30 | Status: AC

## 2017-01-17 NOTE — Addendum Note (Signed)
Addendum  created 01/17/17 1505 by Masahiro Iglesia, MD   Sign clinical note    

## 2017-03-26 DIAGNOSIS — E119 Type 2 diabetes mellitus without complications: Secondary | ICD-10-CM | POA: Diagnosis not present

## 2017-04-08 DIAGNOSIS — I10 Essential (primary) hypertension: Secondary | ICD-10-CM | POA: Diagnosis not present

## 2017-04-08 DIAGNOSIS — E784 Other hyperlipidemia: Secondary | ICD-10-CM | POA: Diagnosis not present

## 2017-04-08 DIAGNOSIS — E119 Type 2 diabetes mellitus without complications: Secondary | ICD-10-CM | POA: Diagnosis not present

## 2017-04-08 DIAGNOSIS — M859 Disorder of bone density and structure, unspecified: Secondary | ICD-10-CM | POA: Diagnosis not present

## 2017-04-15 ENCOUNTER — Other Ambulatory Visit: Payer: Self-pay | Admitting: Internal Medicine

## 2017-04-15 DIAGNOSIS — E784 Other hyperlipidemia: Secondary | ICD-10-CM | POA: Diagnosis not present

## 2017-04-15 DIAGNOSIS — Z0000 Encounter for general adult medical examination without abnormal findings: Secondary | ICD-10-CM | POA: Diagnosis not present

## 2017-04-15 DIAGNOSIS — Z8673 Personal history of transient ischemic attack (TIA), and cerebral infarction without residual deficits: Secondary | ICD-10-CM | POA: Diagnosis not present

## 2017-04-15 DIAGNOSIS — R69 Illness, unspecified: Secondary | ICD-10-CM | POA: Diagnosis not present

## 2017-04-15 DIAGNOSIS — Z23 Encounter for immunization: Secondary | ICD-10-CM | POA: Diagnosis not present

## 2017-04-15 DIAGNOSIS — M859 Disorder of bone density and structure, unspecified: Secondary | ICD-10-CM | POA: Diagnosis not present

## 2017-04-15 DIAGNOSIS — M25519 Pain in unspecified shoulder: Secondary | ICD-10-CM | POA: Diagnosis not present

## 2017-04-15 DIAGNOSIS — E119 Type 2 diabetes mellitus without complications: Secondary | ICD-10-CM | POA: Diagnosis not present

## 2017-04-15 DIAGNOSIS — Z1212 Encounter for screening for malignant neoplasm of rectum: Secondary | ICD-10-CM | POA: Diagnosis not present

## 2017-04-15 DIAGNOSIS — I6789 Other cerebrovascular disease: Secondary | ICD-10-CM | POA: Diagnosis not present

## 2017-04-15 DIAGNOSIS — R0989 Other specified symptoms and signs involving the circulatory and respiratory systems: Secondary | ICD-10-CM | POA: Diagnosis not present

## 2017-04-15 DIAGNOSIS — F17200 Nicotine dependence, unspecified, uncomplicated: Secondary | ICD-10-CM

## 2017-04-15 DIAGNOSIS — Z1231 Encounter for screening mammogram for malignant neoplasm of breast: Secondary | ICD-10-CM | POA: Diagnosis not present

## 2017-04-20 ENCOUNTER — Ambulatory Visit: Payer: Medicare HMO

## 2017-04-25 ENCOUNTER — Inpatient Hospital Stay
Admission: RE | Admit: 2017-04-25 | Discharge: 2017-04-25 | Disposition: A | Discharge status: Home / Self Care | Payer: Medicare HMO | Source: Ambulatory Visit | Attending: Internal Medicine | Admitting: Internal Medicine

## 2017-05-03 ENCOUNTER — Ambulatory Visit
Admission: RE | Admit: 2017-05-03 | Discharge: 2017-05-03 | Disposition: A | Discharge status: Home / Self Care | Payer: Medicare HMO | Source: Ambulatory Visit | Attending: Internal Medicine | Admitting: Internal Medicine

## 2017-05-03 DIAGNOSIS — F17200 Nicotine dependence, unspecified, uncomplicated: Secondary | ICD-10-CM

## 2017-05-03 DIAGNOSIS — R69 Illness, unspecified: Secondary | ICD-10-CM | POA: Diagnosis not present

## 2017-05-03 DIAGNOSIS — Z0000 Encounter for general adult medical examination without abnormal findings: Secondary | ICD-10-CM

## 2017-06-25 DIAGNOSIS — E119 Type 2 diabetes mellitus without complications: Secondary | ICD-10-CM | POA: Diagnosis not present

## 2017-08-01 ENCOUNTER — Other Ambulatory Visit (HOSPITAL_BASED_OUTPATIENT_CLINIC_OR_DEPARTMENT_OTHER): Payer: Self-pay | Admitting: Internal Medicine

## 2017-08-01 DIAGNOSIS — Z1231 Encounter for screening mammogram for malignant neoplasm of breast: Secondary | ICD-10-CM

## 2017-08-03 ENCOUNTER — Ambulatory Visit (HOSPITAL_BASED_OUTPATIENT_CLINIC_OR_DEPARTMENT_OTHER)
Admission: RE | Admit: 2017-08-03 | Discharge: 2017-08-03 | Disposition: A | Discharge status: Home / Self Care | Payer: Medicare HMO | Source: Ambulatory Visit | Attending: Internal Medicine | Admitting: Internal Medicine

## 2017-08-03 DIAGNOSIS — Z1231 Encounter for screening mammogram for malignant neoplasm of breast: Secondary | ICD-10-CM | POA: Diagnosis not present

## 2017-09-24 DIAGNOSIS — E119 Type 2 diabetes mellitus without complications: Secondary | ICD-10-CM | POA: Diagnosis not present

## 2017-10-12 DIAGNOSIS — I10 Essential (primary) hypertension: Secondary | ICD-10-CM | POA: Diagnosis not present

## 2017-10-12 DIAGNOSIS — R945 Abnormal results of liver function studies: Secondary | ICD-10-CM | POA: Diagnosis not present

## 2017-10-12 DIAGNOSIS — Z6829 Body mass index (BMI) 29.0-29.9, adult: Secondary | ICD-10-CM | POA: Diagnosis not present

## 2017-10-12 DIAGNOSIS — I6789 Other cerebrovascular disease: Secondary | ICD-10-CM | POA: Diagnosis not present

## 2017-10-12 DIAGNOSIS — E119 Type 2 diabetes mellitus without complications: Secondary | ICD-10-CM | POA: Diagnosis not present

## 2017-10-12 DIAGNOSIS — Z1389 Encounter for screening for other disorder: Secondary | ICD-10-CM | POA: Diagnosis not present

## 2017-10-12 DIAGNOSIS — E7849 Other hyperlipidemia: Secondary | ICD-10-CM | POA: Diagnosis not present

## 2017-12-24 DIAGNOSIS — E119 Type 2 diabetes mellitus without complications: Secondary | ICD-10-CM | POA: Diagnosis not present

## 2018-03-25 DIAGNOSIS — E119 Type 2 diabetes mellitus without complications: Secondary | ICD-10-CM | POA: Diagnosis not present

## 2018-05-10 DIAGNOSIS — M859 Disorder of bone density and structure, unspecified: Secondary | ICD-10-CM | POA: Diagnosis not present

## 2018-05-17 DIAGNOSIS — I10 Essential (primary) hypertension: Secondary | ICD-10-CM | POA: Diagnosis not present

## 2018-05-17 DIAGNOSIS — E7849 Other hyperlipidemia: Secondary | ICD-10-CM | POA: Diagnosis not present

## 2018-05-17 DIAGNOSIS — E119 Type 2 diabetes mellitus without complications: Secondary | ICD-10-CM | POA: Diagnosis not present

## 2018-05-17 DIAGNOSIS — M859 Disorder of bone density and structure, unspecified: Secondary | ICD-10-CM | POA: Diagnosis not present

## 2018-05-17 DIAGNOSIS — R82998 Other abnormal findings in urine: Secondary | ICD-10-CM | POA: Diagnosis not present

## 2018-05-25 DIAGNOSIS — M859 Disorder of bone density and structure, unspecified: Secondary | ICD-10-CM | POA: Diagnosis not present

## 2018-05-25 DIAGNOSIS — R69 Illness, unspecified: Secondary | ICD-10-CM | POA: Diagnosis not present

## 2018-05-25 DIAGNOSIS — Z0000 Encounter for general adult medical examination without abnormal findings: Secondary | ICD-10-CM | POA: Diagnosis not present

## 2018-05-25 DIAGNOSIS — R945 Abnormal results of liver function studies: Secondary | ICD-10-CM | POA: Diagnosis not present

## 2018-05-25 DIAGNOSIS — E7849 Other hyperlipidemia: Secondary | ICD-10-CM | POA: Diagnosis not present

## 2018-05-25 DIAGNOSIS — Z23 Encounter for immunization: Secondary | ICD-10-CM | POA: Diagnosis not present

## 2018-05-25 DIAGNOSIS — I10 Essential (primary) hypertension: Secondary | ICD-10-CM | POA: Diagnosis not present

## 2018-05-25 DIAGNOSIS — I6789 Other cerebrovascular disease: Secondary | ICD-10-CM | POA: Diagnosis not present

## 2018-05-25 DIAGNOSIS — Z8673 Personal history of transient ischemic attack (TIA), and cerebral infarction without residual deficits: Secondary | ICD-10-CM | POA: Diagnosis not present

## 2018-05-25 DIAGNOSIS — E1169 Type 2 diabetes mellitus with other specified complication: Secondary | ICD-10-CM | POA: Diagnosis not present

## 2018-05-25 DIAGNOSIS — J381 Polyp of vocal cord and larynx: Secondary | ICD-10-CM | POA: Diagnosis not present

## 2018-05-26 ENCOUNTER — Other Ambulatory Visit: Payer: Self-pay | Admitting: Internal Medicine

## 2018-05-26 DIAGNOSIS — Z1212 Encounter for screening for malignant neoplasm of rectum: Secondary | ICD-10-CM | POA: Diagnosis not present

## 2018-05-26 DIAGNOSIS — F17200 Nicotine dependence, unspecified, uncomplicated: Secondary | ICD-10-CM

## 2018-06-02 ENCOUNTER — Ambulatory Visit
Admission: RE | Admit: 2018-06-02 | Discharge: 2018-06-02 | Disposition: A | Discharge status: Home / Self Care | Payer: Medicare HMO | Source: Ambulatory Visit | Attending: Internal Medicine | Admitting: Internal Medicine

## 2018-06-02 DIAGNOSIS — R69 Illness, unspecified: Secondary | ICD-10-CM | POA: Diagnosis not present

## 2018-06-02 DIAGNOSIS — F17200 Nicotine dependence, unspecified, uncomplicated: Secondary | ICD-10-CM

## 2018-06-07 ENCOUNTER — Encounter (HOSPITAL_COMMUNITY): Payer: Medicare HMO

## 2018-06-24 DIAGNOSIS — E119 Type 2 diabetes mellitus without complications: Secondary | ICD-10-CM | POA: Diagnosis not present

## 2018-08-01 ENCOUNTER — Other Ambulatory Visit (HOSPITAL_BASED_OUTPATIENT_CLINIC_OR_DEPARTMENT_OTHER): Payer: Self-pay | Admitting: Internal Medicine

## 2018-08-01 DIAGNOSIS — Z1231 Encounter for screening mammogram for malignant neoplasm of breast: Secondary | ICD-10-CM

## 2018-08-10 ENCOUNTER — Ambulatory Visit (HOSPITAL_BASED_OUTPATIENT_CLINIC_OR_DEPARTMENT_OTHER): Payer: Medicare HMO

## 2018-09-12 ENCOUNTER — Ambulatory Visit (HOSPITAL_BASED_OUTPATIENT_CLINIC_OR_DEPARTMENT_OTHER): Payer: Medicare HMO

## 2018-09-19 ENCOUNTER — Ambulatory Visit (HOSPITAL_BASED_OUTPATIENT_CLINIC_OR_DEPARTMENT_OTHER): Payer: Self-pay

## 2018-10-17 ENCOUNTER — Ambulatory Visit (HOSPITAL_BASED_OUTPATIENT_CLINIC_OR_DEPARTMENT_OTHER)
Admission: RE | Admit: 2018-10-17 | Discharge: 2018-10-17 | Disposition: A | Discharge status: Home / Self Care | Payer: Medicare HMO | Source: Ambulatory Visit | Attending: Internal Medicine | Admitting: Internal Medicine

## 2018-10-17 DIAGNOSIS — Z1231 Encounter for screening mammogram for malignant neoplasm of breast: Secondary | ICD-10-CM | POA: Diagnosis not present

## 2019-01-24 DIAGNOSIS — E1169 Type 2 diabetes mellitus with other specified complication: Secondary | ICD-10-CM | POA: Diagnosis not present

## 2019-01-24 DIAGNOSIS — K1120 Sialoadenitis, unspecified: Secondary | ICD-10-CM | POA: Diagnosis not present

## 2019-01-26 DIAGNOSIS — K1121 Acute sialoadenitis: Secondary | ICD-10-CM | POA: Diagnosis not present

## 2019-04-04 DIAGNOSIS — R69 Illness, unspecified: Secondary | ICD-10-CM | POA: Diagnosis not present

## 2019-07-10 DIAGNOSIS — M859 Disorder of bone density and structure, unspecified: Secondary | ICD-10-CM | POA: Diagnosis not present

## 2019-07-10 DIAGNOSIS — E119 Type 2 diabetes mellitus without complications: Secondary | ICD-10-CM | POA: Diagnosis not present

## 2019-07-10 DIAGNOSIS — I10 Essential (primary) hypertension: Secondary | ICD-10-CM | POA: Diagnosis not present

## 2019-07-17 DIAGNOSIS — Z8673 Personal history of transient ischemic attack (TIA), and cerebral infarction without residual deficits: Secondary | ICD-10-CM | POA: Diagnosis not present

## 2019-07-17 DIAGNOSIS — F329 Major depressive disorder, single episode, unspecified: Secondary | ICD-10-CM | POA: Diagnosis not present

## 2019-07-17 DIAGNOSIS — E1169 Type 2 diabetes mellitus with other specified complication: Secondary | ICD-10-CM | POA: Diagnosis not present

## 2019-07-17 DIAGNOSIS — R0989 Other specified symptoms and signs involving the circulatory and respiratory systems: Secondary | ICD-10-CM | POA: Diagnosis not present

## 2019-07-17 DIAGNOSIS — I679 Cerebrovascular disease, unspecified: Secondary | ICD-10-CM | POA: Diagnosis not present

## 2019-07-17 DIAGNOSIS — Z1331 Encounter for screening for depression: Secondary | ICD-10-CM | POA: Diagnosis not present

## 2019-07-17 DIAGNOSIS — J381 Polyp of vocal cord and larynx: Secondary | ICD-10-CM | POA: Diagnosis not present

## 2019-07-17 DIAGNOSIS — M8580 Other specified disorders of bone density and structure, unspecified site: Secondary | ICD-10-CM | POA: Diagnosis not present

## 2019-07-17 DIAGNOSIS — R945 Abnormal results of liver function studies: Secondary | ICD-10-CM | POA: Diagnosis not present

## 2019-07-17 DIAGNOSIS — Z0000 Encounter for general adult medical examination without abnormal findings: Secondary | ICD-10-CM | POA: Diagnosis not present

## 2019-07-31 DIAGNOSIS — Z1212 Encounter for screening for malignant neoplasm of rectum: Secondary | ICD-10-CM | POA: Diagnosis not present

## 2019-09-11 DIAGNOSIS — R69 Illness, unspecified: Secondary | ICD-10-CM | POA: Diagnosis not present

## 2019-10-12 ENCOUNTER — Ambulatory Visit: Payer: Self-pay

## 2020-02-12 DIAGNOSIS — Z5189 Encounter for other specified aftercare: Secondary | ICD-10-CM | POA: Diagnosis not present

## 2020-02-12 DIAGNOSIS — E1169 Type 2 diabetes mellitus with other specified complication: Secondary | ICD-10-CM | POA: Diagnosis not present

## 2020-04-16 DIAGNOSIS — R32 Unspecified urinary incontinence: Secondary | ICD-10-CM | POA: Diagnosis not present

## 2020-04-16 DIAGNOSIS — K219 Gastro-esophageal reflux disease without esophagitis: Secondary | ICD-10-CM | POA: Diagnosis not present

## 2020-04-16 DIAGNOSIS — M8580 Other specified disorders of bone density and structure, unspecified site: Secondary | ICD-10-CM | POA: Diagnosis not present

## 2020-04-16 DIAGNOSIS — Z008 Encounter for other general examination: Secondary | ICD-10-CM | POA: Diagnosis not present

## 2020-04-16 DIAGNOSIS — K5900 Constipation, unspecified: Secondary | ICD-10-CM | POA: Diagnosis not present

## 2020-04-16 DIAGNOSIS — E785 Hyperlipidemia, unspecified: Secondary | ICD-10-CM | POA: Diagnosis not present

## 2020-04-16 DIAGNOSIS — R69 Illness, unspecified: Secondary | ICD-10-CM | POA: Diagnosis not present

## 2020-04-16 DIAGNOSIS — I69319 Unspecified symptoms and signs involving cognitive functions following cerebral infarction: Secondary | ICD-10-CM | POA: Diagnosis not present

## 2020-04-16 DIAGNOSIS — E1151 Type 2 diabetes mellitus with diabetic peripheral angiopathy without gangrene: Secondary | ICD-10-CM | POA: Diagnosis not present

## 2020-04-16 DIAGNOSIS — E1140 Type 2 diabetes mellitus with diabetic neuropathy, unspecified: Secondary | ICD-10-CM | POA: Diagnosis not present

## 2020-04-16 DIAGNOSIS — I10 Essential (primary) hypertension: Secondary | ICD-10-CM | POA: Diagnosis not present

## 2020-07-17 DIAGNOSIS — E785 Hyperlipidemia, unspecified: Secondary | ICD-10-CM | POA: Diagnosis not present

## 2020-07-17 DIAGNOSIS — E1169 Type 2 diabetes mellitus with other specified complication: Secondary | ICD-10-CM | POA: Diagnosis not present

## 2020-07-17 DIAGNOSIS — M859 Disorder of bone density and structure, unspecified: Secondary | ICD-10-CM | POA: Diagnosis not present

## 2020-07-24 DIAGNOSIS — E785 Hyperlipidemia, unspecified: Secondary | ICD-10-CM | POA: Diagnosis not present

## 2020-07-24 DIAGNOSIS — E1169 Type 2 diabetes mellitus with other specified complication: Secondary | ICD-10-CM | POA: Diagnosis not present

## 2020-07-24 DIAGNOSIS — Z8673 Personal history of transient ischemic attack (TIA), and cerebral infarction without residual deficits: Secondary | ICD-10-CM | POA: Diagnosis not present

## 2020-07-24 DIAGNOSIS — Z0000 Encounter for general adult medical examination without abnormal findings: Secondary | ICD-10-CM | POA: Diagnosis not present

## 2020-07-24 DIAGNOSIS — R69 Illness, unspecified: Secondary | ICD-10-CM | POA: Diagnosis not present

## 2020-07-24 DIAGNOSIS — N318 Other neuromuscular dysfunction of bladder: Secondary | ICD-10-CM | POA: Diagnosis not present

## 2020-07-24 DIAGNOSIS — I10 Essential (primary) hypertension: Secondary | ICD-10-CM | POA: Diagnosis not present

## 2020-07-24 DIAGNOSIS — M8580 Other specified disorders of bone density and structure, unspecified site: Secondary | ICD-10-CM | POA: Diagnosis not present

## 2020-07-24 DIAGNOSIS — I679 Cerebrovascular disease, unspecified: Secondary | ICD-10-CM | POA: Diagnosis not present

## 2020-07-24 DIAGNOSIS — N39498 Other specified urinary incontinence: Secondary | ICD-10-CM | POA: Diagnosis not present

## 2020-07-30 DIAGNOSIS — I10 Essential (primary) hypertension: Secondary | ICD-10-CM | POA: Diagnosis not present

## 2020-07-30 DIAGNOSIS — R82998 Other abnormal findings in urine: Secondary | ICD-10-CM | POA: Diagnosis not present

## 2020-07-30 DIAGNOSIS — Z1212 Encounter for screening for malignant neoplasm of rectum: Secondary | ICD-10-CM | POA: Diagnosis not present

## 2020-08-22 DIAGNOSIS — R300 Dysuria: Secondary | ICD-10-CM | POA: Diagnosis not present

## 2020-08-22 DIAGNOSIS — N390 Urinary tract infection, site not specified: Secondary | ICD-10-CM | POA: Diagnosis not present

## 2020-09-25 DIAGNOSIS — Z823 Family history of stroke: Secondary | ICD-10-CM | POA: Diagnosis not present

## 2020-09-25 DIAGNOSIS — R32 Unspecified urinary incontinence: Secondary | ICD-10-CM | POA: Diagnosis not present

## 2020-09-25 DIAGNOSIS — E1151 Type 2 diabetes mellitus with diabetic peripheral angiopathy without gangrene: Secondary | ICD-10-CM | POA: Diagnosis not present

## 2020-09-25 DIAGNOSIS — E785 Hyperlipidemia, unspecified: Secondary | ICD-10-CM | POA: Diagnosis not present

## 2020-09-25 DIAGNOSIS — E1142 Type 2 diabetes mellitus with diabetic polyneuropathy: Secondary | ICD-10-CM | POA: Diagnosis not present

## 2020-09-25 DIAGNOSIS — R69 Illness, unspecified: Secondary | ICD-10-CM | POA: Diagnosis not present

## 2020-09-25 DIAGNOSIS — Z809 Family history of malignant neoplasm, unspecified: Secondary | ICD-10-CM | POA: Diagnosis not present

## 2020-09-25 DIAGNOSIS — K219 Gastro-esophageal reflux disease without esophagitis: Secondary | ICD-10-CM | POA: Diagnosis not present

## 2020-09-25 DIAGNOSIS — I10 Essential (primary) hypertension: Secondary | ICD-10-CM | POA: Diagnosis not present

## 2021-09-21 DIAGNOSIS — E785 Hyperlipidemia, unspecified: Secondary | ICD-10-CM | POA: Diagnosis not present

## 2021-09-21 DIAGNOSIS — M859 Disorder of bone density and structure, unspecified: Secondary | ICD-10-CM | POA: Diagnosis not present

## 2021-09-21 DIAGNOSIS — I10 Essential (primary) hypertension: Secondary | ICD-10-CM | POA: Diagnosis not present

## 2021-09-21 DIAGNOSIS — E1169 Type 2 diabetes mellitus with other specified complication: Secondary | ICD-10-CM | POA: Diagnosis not present

## 2021-09-22 DIAGNOSIS — Z0000 Encounter for general adult medical examination without abnormal findings: Secondary | ICD-10-CM | POA: Diagnosis not present

## 2021-09-22 DIAGNOSIS — N39498 Other specified urinary incontinence: Secondary | ICD-10-CM | POA: Diagnosis not present

## 2021-09-22 DIAGNOSIS — I679 Cerebrovascular disease, unspecified: Secondary | ICD-10-CM | POA: Diagnosis not present

## 2021-09-22 DIAGNOSIS — Z1212 Encounter for screening for malignant neoplasm of rectum: Secondary | ICD-10-CM | POA: Diagnosis not present

## 2021-09-22 DIAGNOSIS — E1169 Type 2 diabetes mellitus with other specified complication: Secondary | ICD-10-CM | POA: Diagnosis not present

## 2021-09-22 DIAGNOSIS — I10 Essential (primary) hypertension: Secondary | ICD-10-CM | POA: Diagnosis not present

## 2021-09-22 DIAGNOSIS — R911 Solitary pulmonary nodule: Secondary | ICD-10-CM | POA: Diagnosis not present

## 2021-09-22 DIAGNOSIS — E785 Hyperlipidemia, unspecified: Secondary | ICD-10-CM | POA: Diagnosis not present

## 2021-09-22 DIAGNOSIS — F17210 Nicotine dependence, cigarettes, uncomplicated: Secondary | ICD-10-CM | POA: Diagnosis not present

## 2021-09-22 DIAGNOSIS — Z23 Encounter for immunization: Secondary | ICD-10-CM | POA: Diagnosis not present

## 2021-09-22 DIAGNOSIS — M8580 Other specified disorders of bone density and structure, unspecified site: Secondary | ICD-10-CM | POA: Diagnosis not present

## 2021-09-25 ENCOUNTER — Other Ambulatory Visit: Payer: Self-pay | Admitting: Internal Medicine

## 2021-09-25 DIAGNOSIS — Z1231 Encounter for screening mammogram for malignant neoplasm of breast: Secondary | ICD-10-CM

## 2021-09-29 DIAGNOSIS — Z780 Asymptomatic menopausal state: Secondary | ICD-10-CM | POA: Diagnosis not present

## 2021-09-29 DIAGNOSIS — M8589 Other specified disorders of bone density and structure, multiple sites: Secondary | ICD-10-CM | POA: Diagnosis not present

## 2021-10-06 ENCOUNTER — Ambulatory Visit: Payer: Self-pay

## 2021-10-13 ENCOUNTER — Ambulatory Visit
Admission: RE | Admit: 2021-10-13 | Discharge: 2021-10-13 | Disposition: A | Discharge status: Home / Self Care | Payer: Medicare HMO | Source: Ambulatory Visit | Attending: Internal Medicine | Admitting: Internal Medicine

## 2021-10-13 DIAGNOSIS — Z1231 Encounter for screening mammogram for malignant neoplasm of breast: Secondary | ICD-10-CM | POA: Diagnosis not present

## 2022-03-02 DIAGNOSIS — Z1211 Encounter for screening for malignant neoplasm of colon: Secondary | ICD-10-CM | POA: Diagnosis not present

## 2022-03-10 LAB — COLOGUARD: COLOGUARD: POSITIVE — AB

## 2022-07-21 DIAGNOSIS — H52203 Unspecified astigmatism, bilateral: Secondary | ICD-10-CM | POA: Diagnosis not present

## 2022-07-21 DIAGNOSIS — H2513 Age-related nuclear cataract, bilateral: Secondary | ICD-10-CM | POA: Diagnosis not present

## 2022-07-21 DIAGNOSIS — E119 Type 2 diabetes mellitus without complications: Secondary | ICD-10-CM | POA: Diagnosis not present

## 2022-08-23 DIAGNOSIS — H2513 Age-related nuclear cataract, bilateral: Secondary | ICD-10-CM | POA: Diagnosis not present

## 2022-09-28 DIAGNOSIS — E785 Hyperlipidemia, unspecified: Secondary | ICD-10-CM | POA: Diagnosis not present

## 2022-09-28 DIAGNOSIS — I10 Essential (primary) hypertension: Secondary | ICD-10-CM | POA: Diagnosis not present

## 2022-09-28 DIAGNOSIS — E1169 Type 2 diabetes mellitus with other specified complication: Secondary | ICD-10-CM | POA: Diagnosis not present

## 2022-09-28 DIAGNOSIS — K219 Gastro-esophageal reflux disease without esophagitis: Secondary | ICD-10-CM | POA: Diagnosis not present

## 2022-10-05 ENCOUNTER — Other Ambulatory Visit: Payer: Self-pay | Admitting: Internal Medicine

## 2022-10-05 DIAGNOSIS — Z1331 Encounter for screening for depression: Secondary | ICD-10-CM | POA: Diagnosis not present

## 2022-10-05 DIAGNOSIS — R911 Solitary pulmonary nodule: Secondary | ICD-10-CM | POA: Diagnosis not present

## 2022-10-05 DIAGNOSIS — I10 Essential (primary) hypertension: Secondary | ICD-10-CM | POA: Diagnosis not present

## 2022-10-05 DIAGNOSIS — E785 Hyperlipidemia, unspecified: Secondary | ICD-10-CM | POA: Diagnosis not present

## 2022-10-05 DIAGNOSIS — Z8673 Personal history of transient ischemic attack (TIA), and cerebral infarction without residual deficits: Secondary | ICD-10-CM | POA: Diagnosis not present

## 2022-10-05 DIAGNOSIS — F329 Major depressive disorder, single episode, unspecified: Secondary | ICD-10-CM | POA: Diagnosis not present

## 2022-10-05 DIAGNOSIS — Z1339 Encounter for screening examination for other mental health and behavioral disorders: Secondary | ICD-10-CM | POA: Diagnosis not present

## 2022-10-05 DIAGNOSIS — I679 Cerebrovascular disease, unspecified: Secondary | ICD-10-CM | POA: Diagnosis not present

## 2022-10-05 DIAGNOSIS — Z0000 Encounter for general adult medical examination without abnormal findings: Secondary | ICD-10-CM | POA: Diagnosis not present

## 2022-10-05 DIAGNOSIS — R69 Illness, unspecified: Secondary | ICD-10-CM | POA: Diagnosis not present

## 2022-10-05 DIAGNOSIS — E1169 Type 2 diabetes mellitus with other specified complication: Secondary | ICD-10-CM | POA: Diagnosis not present

## 2022-10-05 DIAGNOSIS — Z79899 Other long term (current) drug therapy: Secondary | ICD-10-CM | POA: Diagnosis not present

## 2022-10-27 ENCOUNTER — Encounter: Payer: Self-pay | Admitting: Internal Medicine

## 2022-10-29 ENCOUNTER — Ambulatory Visit
Admission: RE | Admit: 2022-10-29 | Discharge: 2022-10-29 | Disposition: A | Discharge status: Home / Self Care | Payer: Medicare HMO | Source: Ambulatory Visit | Attending: Internal Medicine | Admitting: Internal Medicine

## 2022-10-29 DIAGNOSIS — R911 Solitary pulmonary nodule: Secondary | ICD-10-CM

## 2022-11-12 DIAGNOSIS — E785 Hyperlipidemia, unspecified: Secondary | ICD-10-CM | POA: Diagnosis not present

## 2022-11-12 DIAGNOSIS — Z008 Encounter for other general examination: Secondary | ICD-10-CM | POA: Diagnosis not present

## 2022-11-12 DIAGNOSIS — Z8042 Family history of malignant neoplasm of prostate: Secondary | ICD-10-CM | POA: Diagnosis not present

## 2022-11-12 DIAGNOSIS — I10 Essential (primary) hypertension: Secondary | ICD-10-CM | POA: Diagnosis not present

## 2022-11-12 DIAGNOSIS — Z803 Family history of malignant neoplasm of breast: Secondary | ICD-10-CM | POA: Diagnosis not present

## 2022-11-12 DIAGNOSIS — H259 Unspecified age-related cataract: Secondary | ICD-10-CM | POA: Diagnosis not present

## 2022-11-12 DIAGNOSIS — I739 Peripheral vascular disease, unspecified: Secondary | ICD-10-CM | POA: Diagnosis not present

## 2022-11-12 DIAGNOSIS — Z811 Family history of alcohol abuse and dependence: Secondary | ICD-10-CM | POA: Diagnosis not present

## 2022-11-12 DIAGNOSIS — K219 Gastro-esophageal reflux disease without esophagitis: Secondary | ICD-10-CM | POA: Diagnosis not present

## 2022-11-12 DIAGNOSIS — F17200 Nicotine dependence, unspecified, uncomplicated: Secondary | ICD-10-CM | POA: Diagnosis not present

## 2022-11-12 DIAGNOSIS — Z8673 Personal history of transient ischemic attack (TIA), and cerebral infarction without residual deficits: Secondary | ICD-10-CM | POA: Diagnosis not present

## 2022-11-12 DIAGNOSIS — R69 Illness, unspecified: Secondary | ICD-10-CM | POA: Diagnosis not present

## 2022-11-12 DIAGNOSIS — Z8249 Family history of ischemic heart disease and other diseases of the circulatory system: Secondary | ICD-10-CM | POA: Diagnosis not present

## 2022-11-12 DIAGNOSIS — Z823 Family history of stroke: Secondary | ICD-10-CM | POA: Diagnosis not present

## 2022-11-25 ENCOUNTER — Other Ambulatory Visit: Payer: Self-pay | Admitting: *Deleted

## 2022-11-25 DIAGNOSIS — M79604 Pain in right leg: Secondary | ICD-10-CM

## 2022-11-29 ENCOUNTER — Other Ambulatory Visit (HOSPITAL_COMMUNITY): Payer: Self-pay | Admitting: Internal Medicine

## 2022-11-29 ENCOUNTER — Ambulatory Visit (HOSPITAL_COMMUNITY)
Admission: RE | Admit: 2022-11-29 | Discharge: 2022-11-29 | Disposition: A | Discharge status: Home / Self Care | Payer: Medicare HMO | Source: Ambulatory Visit | Attending: Surgery | Admitting: Surgery

## 2022-11-29 DIAGNOSIS — I739 Peripheral vascular disease, unspecified: Secondary | ICD-10-CM

## 2022-11-29 LAB — VAS US ABI WITH/WO TBI
Left ABI: 0.78
Right ABI: 0.79

## 2022-11-30 DIAGNOSIS — R1032 Left lower quadrant pain: Secondary | ICD-10-CM | POA: Diagnosis not present

## 2022-11-30 DIAGNOSIS — K219 Gastro-esophageal reflux disease without esophagitis: Secondary | ICD-10-CM | POA: Diagnosis not present

## 2022-11-30 DIAGNOSIS — R102 Pelvic and perineal pain: Secondary | ICD-10-CM | POA: Diagnosis not present

## 2022-11-30 DIAGNOSIS — I10 Essential (primary) hypertension: Secondary | ICD-10-CM | POA: Diagnosis not present

## 2022-12-01 ENCOUNTER — Other Ambulatory Visit: Payer: Self-pay | Admitting: Internal Medicine

## 2022-12-01 DIAGNOSIS — R109 Unspecified abdominal pain: Secondary | ICD-10-CM

## 2022-12-03 ENCOUNTER — Ambulatory Visit
Admission: RE | Admit: 2022-12-03 | Discharge: 2022-12-03 | Disposition: A | Discharge status: Home / Self Care | Payer: Medicare HMO | Source: Ambulatory Visit | Attending: Internal Medicine | Admitting: Internal Medicine

## 2022-12-03 DIAGNOSIS — I862 Pelvic varices: Secondary | ICD-10-CM | POA: Diagnosis not present

## 2022-12-03 DIAGNOSIS — N281 Cyst of kidney, acquired: Secondary | ICD-10-CM | POA: Diagnosis not present

## 2022-12-03 DIAGNOSIS — I878 Other specified disorders of veins: Secondary | ICD-10-CM | POA: Diagnosis not present

## 2022-12-03 DIAGNOSIS — R109 Unspecified abdominal pain: Secondary | ICD-10-CM

## 2022-12-03 DIAGNOSIS — N838 Other noninflammatory disorders of ovary, fallopian tube and broad ligament: Secondary | ICD-10-CM | POA: Diagnosis not present

## 2022-12-03 MED ORDER — IOPAMIDOL (ISOVUE-300) INJECTION 61%
100.0000 mL | Freq: Once | INTRAVENOUS | Status: AC | PRN
  Administered 2022-12-03: 100 mL via INTRAVENOUS

## 2022-12-06 ENCOUNTER — Other Ambulatory Visit: Payer: Medicare HMO

## 2022-12-22 ENCOUNTER — Encounter: Payer: Medicare HMO | Admitting: Vascular Surgery

## 2023-02-16 ENCOUNTER — Encounter: Payer: Medicare HMO | Admitting: Vascular Surgery

## 2023-05-30 IMAGING — MG MM DIGITAL SCREENING BILAT W/ TOMO AND CAD
8 series · 9 of 24 positions shown · non-contrast
Comparison: Previous exam(s).

CLINICAL DATA: Screening.

EXAM:
DIGITAL SCREENING BILATERAL MAMMOGRAM WITH TOMOSYNTHESIS AND CAD
TECHNIQUE: Bilateral screening digital craniocaudal and mediolateral oblique
mammograms were obtained. Bilateral screening digital breast
tomosynthesis was performed. The images were evaluated with
computer-aided detection.

[R MLO synth-2D]
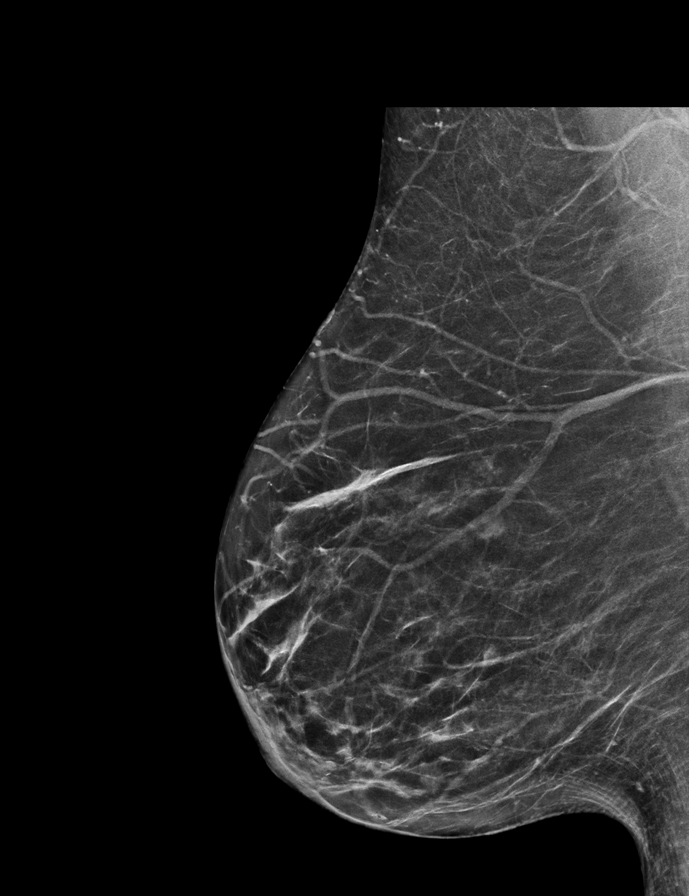

[R CC synth-2D]
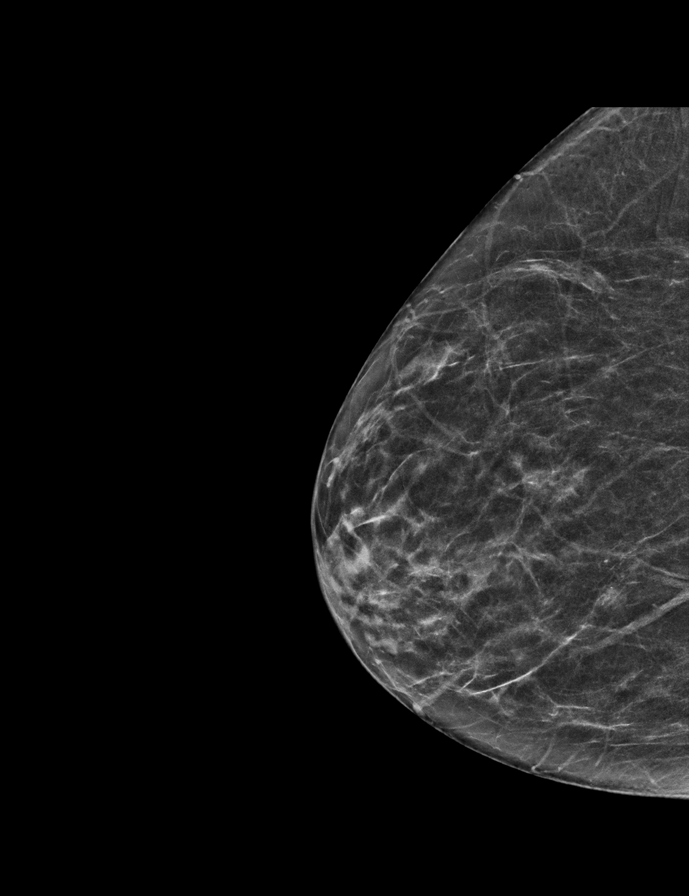

[L CC synth-2D]
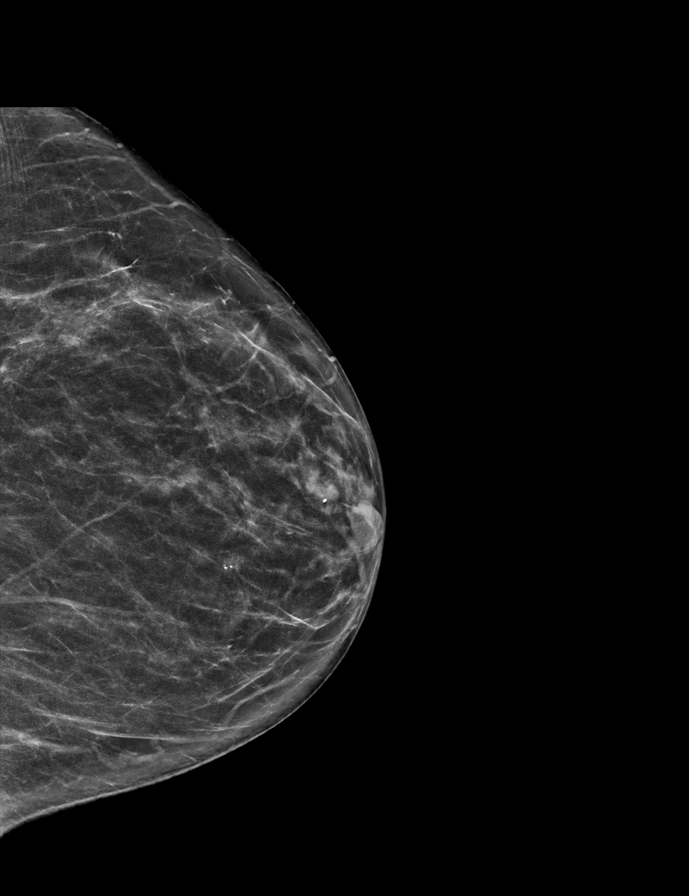

[L MLO synth-2D]
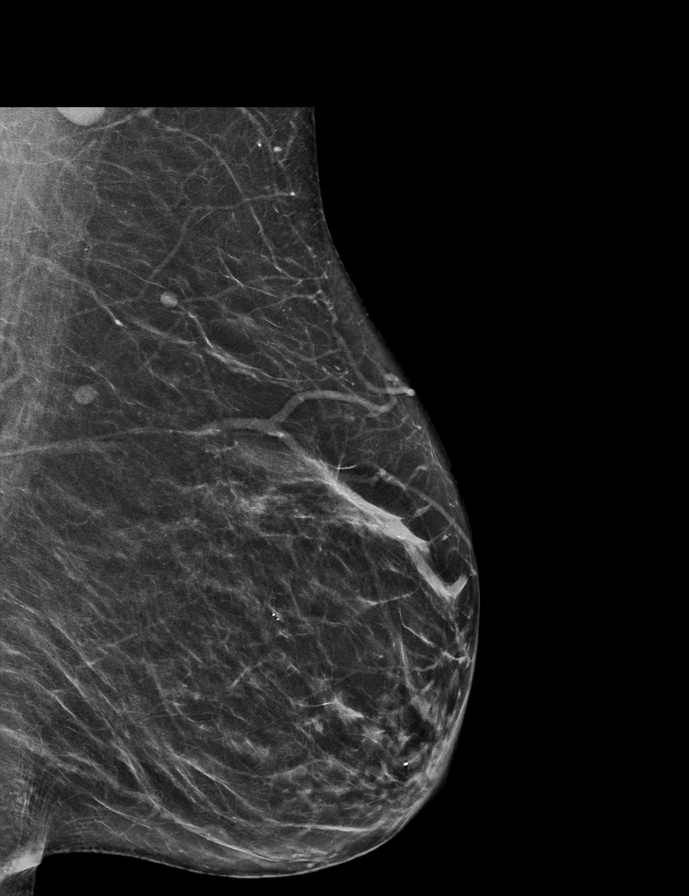

[R CC tomo · 2 of 52 frames shown]
[frame 17/52]
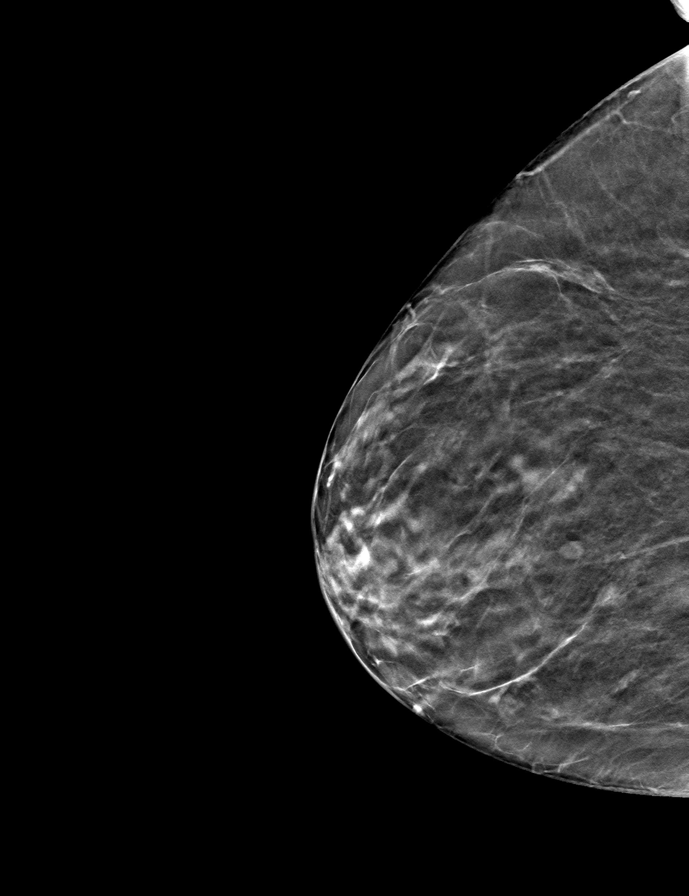
[frame 27/52]
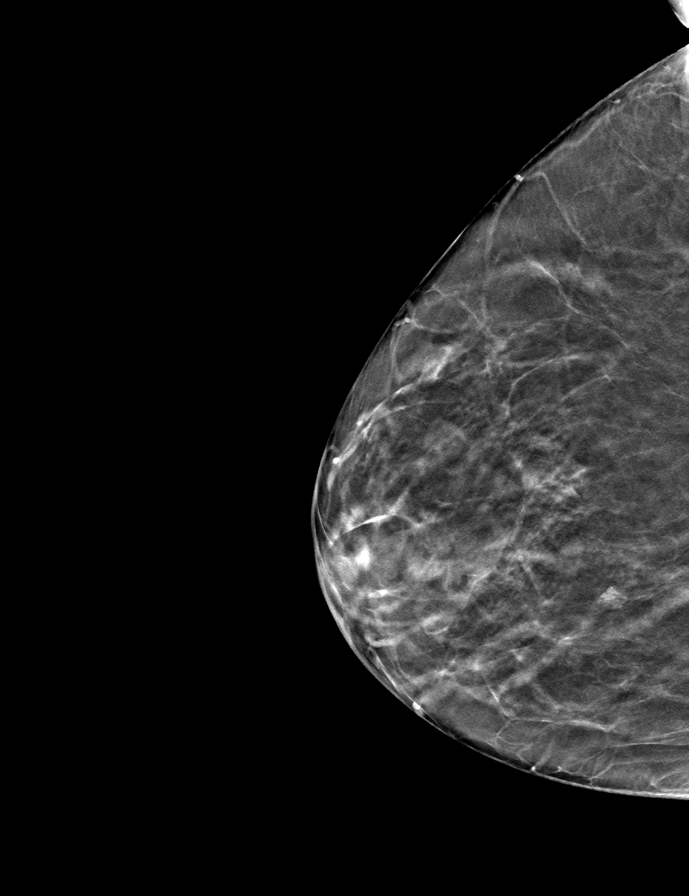

[L MLO tomo · tomo slice 33/64.0]
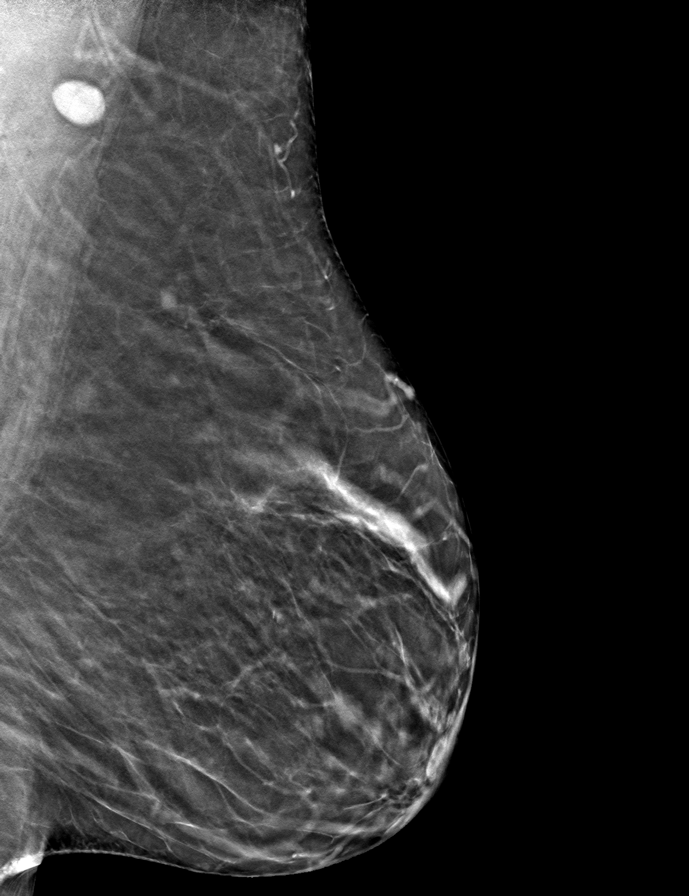

[L CC tomo · tomo slice 29/58.0]
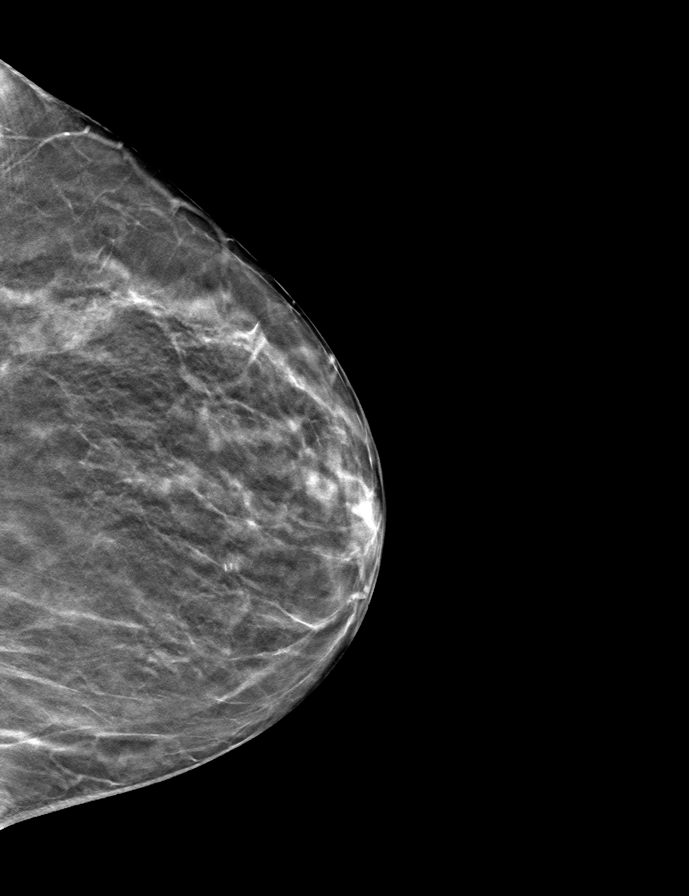

[R MLO tomo · tomo slice 31/60.0]
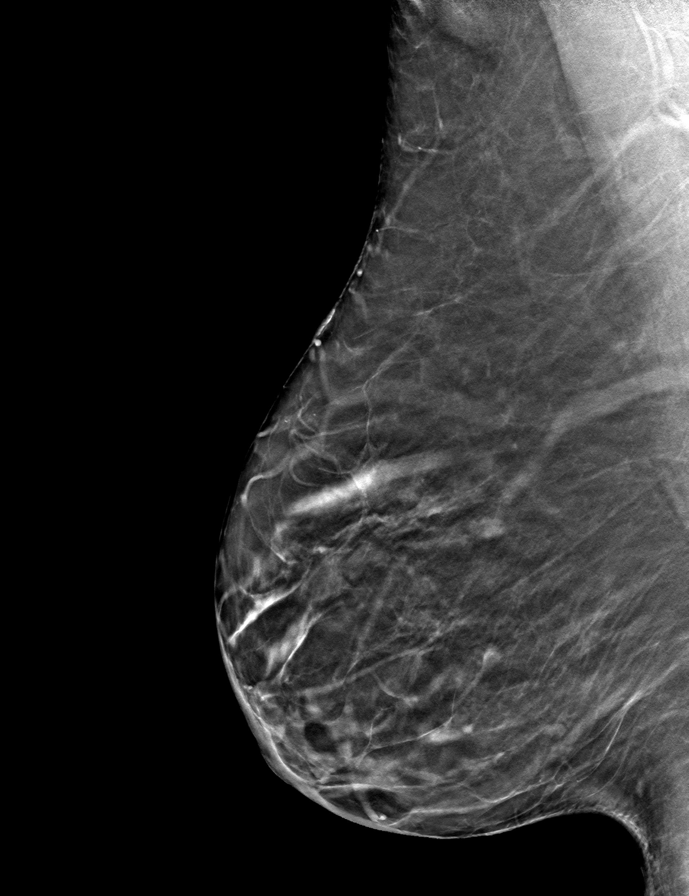

[9 of 24 positions shown; findings below may reference images not displayed]

ACR Breast Density Category b: There are scattered areas of
fibroglandular density.
FINDINGS: There are no findings suspicious for malignancy.
IMPRESSION: No mammographic evidence of malignancy. A result letter of this
screening mammogram will be mailed directly to the patient.

RECOMMENDATION:
Screening mammogram in one year. (Code:51-O-LD2)

BI-RADS CATEGORY  1: Negative.

## 2023-06-17 DIAGNOSIS — I739 Peripheral vascular disease, unspecified: Secondary | ICD-10-CM | POA: Diagnosis not present

## 2023-06-17 DIAGNOSIS — E1169 Type 2 diabetes mellitus with other specified complication: Secondary | ICD-10-CM | POA: Diagnosis not present

## 2023-06-17 DIAGNOSIS — I10 Essential (primary) hypertension: Secondary | ICD-10-CM | POA: Diagnosis not present

## 2023-06-17 DIAGNOSIS — I679 Cerebrovascular disease, unspecified: Secondary | ICD-10-CM | POA: Diagnosis not present

## 2023-06-17 DIAGNOSIS — M8580 Other specified disorders of bone density and structure, unspecified site: Secondary | ICD-10-CM | POA: Diagnosis not present

## 2023-06-17 DIAGNOSIS — R911 Solitary pulmonary nodule: Secondary | ICD-10-CM | POA: Diagnosis not present

## 2023-06-17 DIAGNOSIS — K219 Gastro-esophageal reflux disease without esophagitis: Secondary | ICD-10-CM | POA: Diagnosis not present

## 2023-06-17 DIAGNOSIS — E785 Hyperlipidemia, unspecified: Secondary | ICD-10-CM | POA: Diagnosis not present

## 2023-09-28 DIAGNOSIS — E785 Hyperlipidemia, unspecified: Secondary | ICD-10-CM | POA: Diagnosis not present

## 2023-12-12 DIAGNOSIS — R82998 Other abnormal findings in urine: Secondary | ICD-10-CM | POA: Diagnosis not present

## 2023-12-12 DIAGNOSIS — Z79899 Other long term (current) drug therapy: Secondary | ICD-10-CM | POA: Diagnosis not present

## 2023-12-12 DIAGNOSIS — E1169 Type 2 diabetes mellitus with other specified complication: Secondary | ICD-10-CM | POA: Diagnosis not present

## 2023-12-12 DIAGNOSIS — I10 Essential (primary) hypertension: Secondary | ICD-10-CM | POA: Diagnosis not present

## 2023-12-12 DIAGNOSIS — E785 Hyperlipidemia, unspecified: Secondary | ICD-10-CM | POA: Diagnosis not present

## 2023-12-12 DIAGNOSIS — E559 Vitamin D deficiency, unspecified: Secondary | ICD-10-CM | POA: Diagnosis not present

## 2023-12-12 DIAGNOSIS — Z1212 Encounter for screening for malignant neoplasm of rectum: Secondary | ICD-10-CM | POA: Diagnosis not present

## 2023-12-19 DIAGNOSIS — I679 Cerebrovascular disease, unspecified: Secondary | ICD-10-CM | POA: Diagnosis not present

## 2023-12-19 DIAGNOSIS — I10 Essential (primary) hypertension: Secondary | ICD-10-CM | POA: Diagnosis not present

## 2023-12-19 DIAGNOSIS — E785 Hyperlipidemia, unspecified: Secondary | ICD-10-CM | POA: Diagnosis not present

## 2023-12-19 DIAGNOSIS — F17210 Nicotine dependence, cigarettes, uncomplicated: Secondary | ICD-10-CM | POA: Diagnosis not present

## 2023-12-19 DIAGNOSIS — R911 Solitary pulmonary nodule: Secondary | ICD-10-CM | POA: Diagnosis not present

## 2023-12-19 DIAGNOSIS — F329 Major depressive disorder, single episode, unspecified: Secondary | ICD-10-CM | POA: Diagnosis not present

## 2023-12-19 DIAGNOSIS — I739 Peripheral vascular disease, unspecified: Secondary | ICD-10-CM | POA: Diagnosis not present

## 2023-12-19 DIAGNOSIS — K219 Gastro-esophageal reflux disease without esophagitis: Secondary | ICD-10-CM | POA: Diagnosis not present

## 2023-12-19 DIAGNOSIS — Z0000 Encounter for general adult medical examination without abnormal findings: Secondary | ICD-10-CM | POA: Diagnosis not present

## 2023-12-19 DIAGNOSIS — E1169 Type 2 diabetes mellitus with other specified complication: Secondary | ICD-10-CM | POA: Diagnosis not present

## 2023-12-19 DIAGNOSIS — M8580 Other specified disorders of bone density and structure, unspecified site: Secondary | ICD-10-CM | POA: Diagnosis not present

## 2023-12-19 DIAGNOSIS — Z8673 Personal history of transient ischemic attack (TIA), and cerebral infarction without residual deficits: Secondary | ICD-10-CM | POA: Diagnosis not present

## 2023-12-21 ENCOUNTER — Other Ambulatory Visit: Payer: Self-pay | Admitting: Internal Medicine

## 2023-12-21 DIAGNOSIS — F17210 Nicotine dependence, cigarettes, uncomplicated: Secondary | ICD-10-CM

## 2024-01-10 ENCOUNTER — Other Ambulatory Visit: Payer: Self-pay | Admitting: Internal Medicine

## 2024-01-10 DIAGNOSIS — R911 Solitary pulmonary nodule: Secondary | ICD-10-CM

## 2024-01-16 DIAGNOSIS — M8589 Other specified disorders of bone density and structure, multiple sites: Secondary | ICD-10-CM | POA: Diagnosis not present

## 2024-01-19 ENCOUNTER — Other Ambulatory Visit

## 2024-02-08 ENCOUNTER — Other Ambulatory Visit: Payer: Self-pay

## 2024-03-22 ENCOUNTER — Ambulatory Visit: Admitting: Podiatry

## 2024-03-26 ENCOUNTER — Ambulatory Visit: Admitting: Podiatry

## 2024-03-26 DIAGNOSIS — B070 Plantar wart: Secondary | ICD-10-CM

## 2024-03-26 DIAGNOSIS — M79672 Pain in left foot: Secondary | ICD-10-CM | POA: Diagnosis not present

## 2024-03-26 NOTE — Progress Notes (Signed)
 Patient presents with a painful lesion on the plantar aspect of the hallux left.  It has been bothering her for several months.  She has been putting Neosporin on it.  Does not recall any injury to the area.  She is a type II diabetic.  No history of any ulcers or infections in the foot.   Physical exam:  General appearance: Pleasant, and in no acute distress. AOx3.  Vascular: Pedal pulses: DP 2/4 bilaterally, PT 2/4 bilaterally. Mild edema lower legs bilaterally. Capillary fill time immediate b/l.SABRA  Neurological: Light touch intact feet bilaterally.  Normal Achilles reflex bilaterally.  No clonus or spasticity noted.   Dermatologic:   Verrucous lesion with pinpoint hemorrhages and skin lines going around the lesion plantar aspect hallux left.  There is also a small satellite lesion that had pinpoint bleeding upon debridement.  Skin normal temperature bilaterally.  Skin normal color, tone, and texture bilaterally.   Musculoskeletal: Tenderness plantar hallux left.  No tenderness with range of motion interphalangeal joint of hallux left.    Diagnosis: 1.  Pain in the left foot. 2.  Plantar verruca x 2 left foot.  Plan: -New patient office visit for evaluation and management level 3.  Modifier 25 -Discussed plantar verruca and there were viral etiology.  Discussed treatment methods. -Applied Salinocaine compound to lesion(s) as noted in physical exam after debriding lesions to pinpoint bleeding.  Salinocaine applied to lesion(s) and covered with an occlusive dressing with Coban wrap.  Written and oral instructions given to patient.     Return 2 weeks follow-up verrucous lesions x 2 hallux left

## 2024-04-09 ENCOUNTER — Ambulatory Visit (INDEPENDENT_AMBULATORY_CARE_PROVIDER_SITE_OTHER): Admitting: Podiatry

## 2024-04-09 ENCOUNTER — Telehealth: Payer: Self-pay | Admitting: Lab

## 2024-04-09 ENCOUNTER — Encounter: Payer: Self-pay | Admitting: Podiatry

## 2024-04-09 DIAGNOSIS — L301 Dyshidrosis [pompholyx]: Secondary | ICD-10-CM | POA: Diagnosis not present

## 2024-04-09 DIAGNOSIS — B070 Plantar wart: Secondary | ICD-10-CM

## 2024-04-09 MED ORDER — CLOBETASOL PROPIONATE 0.05 % EX CREA: 1.0000 | TOPICAL_CREAM | Freq: Two times a day (BID) | CUTANEOUS | 0 refills | Status: AC

## 2024-04-09 MED ORDER — CLOBETASOL PROPIONATE 0.05 % EX OINT
1.0000 | TOPICAL_OINTMENT | Freq: Two times a day (BID) | CUTANEOUS | 0 refills | Status: AC
Start: 2024-04-09 — End: ?

## 2024-04-09 NOTE — Telephone Encounter (Signed)
 Patient states you prescribed an ointment today and she allergic to ointments if you can prescribe a cream.

## 2024-04-09 NOTE — Progress Notes (Signed)
 Patient presents follow-up 2 verrucous lesions on the hallux left.  States she had been cleaning with some soapy water and also using some OTC antiseptic solution.  Says the foot got very itchy at both the hallux 2nd and 3rd toes and in the sulcus of the foot on the left foot.  Had redness and blisters develop.   Physical exam:  General appearance: Pleasant, and in no acute distress. AOx3.  Vascular: Pedal pulses: DP 2/4 bilaterally, PT 2/4 bilaterally. Mild edema lower legs bilaterally.   Neurological: Grossly intact bilaterally  Dermatologic:   1  verrucous lesion has resolved.  There is still 1 remaining which is significantly improved and thinned plantar aspect of the hallux left around the first metatarsal phalangeal joint and extending over the 2nd and 3rd toes there is a vesicular red erythematous rash with vesicles measuring 3 to 4 mm in diameter.  Musculoskeletal: No tender with range of motion of inner phalangeal joint hallux or first MTP left.    Diagnosis: 1.  Dyshidrotic dermatitis foot left 2.  Plantar verruca hallux left  Plan: -Established office visit for evaluation and management level 3 of problem separate from procedure done today.  Modifier 25.  Wrote prescription for clobetasol  ointment.  In manage problem in addition neurotic dermatitis on the left foot. -Discussed the rash she has been getting.  Most likely a dyshidrotic dermatitis however contact dermatitis with the antiseptic.  Not appear to be fungal in nature although this cannot be ruled out -Rx clobetasol  ointment 0.05%, apply twice daily to affected areas foot, no refills.  -Applied Salinocaine compound to lesion(s) as noted in physical exam after debriding lesions to pinpoint bleeding.  Salinocaine applied to lesion(s) and covered with an occlusive dressing with Coban wrap.  Written and oral instructions given to patient.  -Return in 2 weeks for follow-up and reevaluation.

## 2024-04-09 NOTE — Addendum Note (Signed)
 Addended by: GERRIT ANDREZ CROME on: 04/09/2024 04:29 PM   Modules accepted: Orders

## 2024-04-23 ENCOUNTER — Ambulatory Visit
Admission: RE | Admit: 2024-04-23 | Discharge: 2024-04-23 | Disposition: A | Discharge status: Home / Self Care | Payer: Self-pay | Source: Ambulatory Visit | Attending: Internal Medicine | Admitting: Internal Medicine

## 2024-04-23 DIAGNOSIS — R911 Solitary pulmonary nodule: Secondary | ICD-10-CM

## 2024-04-23 DIAGNOSIS — J432 Centrilobular emphysema: Secondary | ICD-10-CM | POA: Diagnosis not present

## 2024-04-23 DIAGNOSIS — R918 Other nonspecific abnormal finding of lung field: Secondary | ICD-10-CM | POA: Diagnosis not present

## 2024-04-24 ENCOUNTER — Ambulatory Visit (INDEPENDENT_AMBULATORY_CARE_PROVIDER_SITE_OTHER): Admitting: Podiatry

## 2024-04-24 ENCOUNTER — Encounter: Payer: Self-pay | Admitting: Podiatry

## 2024-04-24 DIAGNOSIS — B070 Plantar wart: Secondary | ICD-10-CM

## 2024-04-24 DIAGNOSIS — L301 Dyshidrosis [pompholyx]: Secondary | ICD-10-CM

## 2024-04-24 NOTE — Progress Notes (Signed)
 He presents for follow-up on ration of verrucous lesion on the left foot.  She is doing much better since using clobetasol  says the foot snot itching is much better.  Says the skin still little bit tender where the rash was.  Says the wart does not hurt as much now.   Physical exam:  General appearance: Pleasant, and in no acute distress. AOx3.  Vascular: Pedal pulses: DP 2/4 bilaterally, PT 2/4 bilaterally. Mild edema lower legs bilaterally. Capillary fill time immediate.  Neurological:   Dermatologic:   Verrucous lesion plantar hallux left much improved.  Reduced in size and depth.  Skin lines still partially go around the lesion.  A few pinpoint hemorrhages.  Rashes improved considerably on the left foot with some red areas and some peeling skin but no blistering.  Musculoskeletal:      Diagnosis: 1.  Plantar verruca left-improved 2.  Dyshidrotic dermatitis left foot-improved  Plan: -Continue clobetasol  on the area of the rash.  Continue using twice daily.  -Applied Salinocaine compound to lesion(s) as noted in physical exam after debriding lesions to pinpoint bleeding.  Salinocaine applied to lesion(s) and covered with an occlusive dressing with Coban wrap.  Written and oral instructions given to patient.  -Return in 2 weeks for follow-up and reevaluation.   Return 2 weeks follow-up dyshidrosis and verruca left

## 2024-05-15 ENCOUNTER — Ambulatory Visit: Admitting: Podiatry

## 2024-05-22 ENCOUNTER — Ambulatory Visit: Admitting: Podiatry

## 2024-06-12 ENCOUNTER — Encounter: Payer: Self-pay | Admitting: Podiatry

## 2024-06-12 ENCOUNTER — Ambulatory Visit: Admitting: Podiatry

## 2024-06-12 DIAGNOSIS — B070 Plantar wart: Secondary | ICD-10-CM | POA: Diagnosis not present

## 2024-06-12 NOTE — Progress Notes (Signed)
 Close follow-up verruca lesion hallux left and rash left foot.  Says the rash seems to be gone no itching anymore.  Minimal discomfort at the warts   Physical exam:  General appearance: Pleasant, and in no acute distress. AOx3.  Vascular: Pedal pulses: DP 2/4 bilaterally, PT 2/4 bilaterally.  Mild edema lower legs bilaterally. Capillary fill time immediate.  Neurological: Grossly intact bilaterally  Dermatologic:   Small 3 to 4 mm diameter remanent of a verrucous lesion almost completely resolved very thin at this point.  Dyshidrotic rash is resolved completely.  Skin normal temperature bilaterally.  Skin normal color, tone, and texture bilaterally.   Musculoskeletal:   Diagnosis: 1.  Plantar verruca plantar left hallux  Plan: -Will do 1 more application of Salinocaine hopefully this will laminated.  Told to keep a close watch on it if it does come back again may need to consider cantharidin or a biopsy. -She can discontinue using the clobetasol  at this point.  Since the rash is resolved.  Told her if it does come back just immediately start using it for a few days and it should resolve  Return as needed
